# Patient Record
Sex: Female | Born: 1940
Health system: Southern US, Community
[De-identification: ages and names within clinical notes are randomized; demographics above are authoritative.]

## PROBLEM LIST (undated history)

## (undated) DIAGNOSIS — R5381 Other malaise: Secondary | ICD-10-CM

## (undated) DIAGNOSIS — IMO0002 Reserved for concepts with insufficient information to code with codable children: Secondary | ICD-10-CM

## (undated) DIAGNOSIS — E039 Hypothyroidism, unspecified: Secondary | ICD-10-CM

## (undated) DIAGNOSIS — E7800 Pure hypercholesterolemia, unspecified: Secondary | ICD-10-CM

## (undated) DIAGNOSIS — R002 Palpitations: Secondary | ICD-10-CM

## (undated) DIAGNOSIS — R5383 Other fatigue: Secondary | ICD-10-CM

## (undated) HISTORY — PX: CHOLECYSTECTOMY: SHX55

## (undated) HISTORY — DX: Reserved for concepts with insufficient information to code with codable children: IMO0002

## (undated) HISTORY — DX: Hypothyroidism, unspecified: E03.9

## (undated) HISTORY — PX: BACK SURGERY: SHX140

## (undated) HISTORY — DX: Pure hypercholesterolemia, unspecified: E78.00

## (undated) HISTORY — DX: Palpitations: R00.2

## (undated) HISTORY — PX: CATARACT EXTRACTION: SUR2

## (undated) HISTORY — DX: Other malaise: R53.81

## (undated) HISTORY — DX: Other fatigue: R53.83

---

## 1998-03-11 ENCOUNTER — Other Ambulatory Visit: Admission: RE | Admit: 1998-03-11 | Discharge: 1998-03-11 | Payer: Self-pay | Admitting: Obstetrics and Gynecology

## 1999-04-24 ENCOUNTER — Other Ambulatory Visit: Admission: RE | Admit: 1999-04-24 | Discharge: 1999-04-24 | Payer: Self-pay | Admitting: Obstetrics and Gynecology

## 2001-08-13 ENCOUNTER — Ambulatory Visit (HOSPITAL_COMMUNITY): Admission: RE | Admit: 2001-08-13 | Discharge: 2001-08-13 | Payer: Self-pay | Admitting: Gastroenterology

## 2005-09-26 ENCOUNTER — Other Ambulatory Visit
Admission: RE | Admit: 2005-09-26 | Discharge: 2005-09-26 | Payer: Self-pay | Admitting: Physical Medicine & Rehabilitation

## 2006-02-12 ENCOUNTER — Encounter (HOSPITAL_COMMUNITY): Admission: RE | Admit: 2006-02-12 | Discharge: 2006-03-14 | Payer: Self-pay | Admitting: Orthopedic Surgery

## 2006-11-06 ENCOUNTER — Other Ambulatory Visit: Admission: RE | Admit: 2006-11-06 | Discharge: 2006-11-06 | Payer: Self-pay | Admitting: Obstetrics and Gynecology

## 2007-11-10 ENCOUNTER — Other Ambulatory Visit: Admission: RE | Admit: 2007-11-10 | Discharge: 2007-11-10 | Payer: Self-pay | Admitting: Obstetrics and Gynecology

## 2008-11-15 ENCOUNTER — Other Ambulatory Visit: Admission: RE | Admit: 2008-11-15 | Discharge: 2008-11-15 | Payer: Self-pay | Admitting: Obstetrics and Gynecology

## 2009-11-15 ENCOUNTER — Other Ambulatory Visit: Admission: RE | Admit: 2009-11-15 | Discharge: 2009-11-15 | Payer: Self-pay | Admitting: Obstetrics and Gynecology

## 2013-01-12 ENCOUNTER — Ambulatory Visit: Payer: Self-pay | Admitting: Internal Medicine

## 2013-02-02 ENCOUNTER — Ambulatory Visit (INDEPENDENT_AMBULATORY_CARE_PROVIDER_SITE_OTHER): Payer: Medicare HMO | Admitting: Cardiology

## 2013-02-02 ENCOUNTER — Encounter: Payer: Self-pay | Admitting: Cardiology

## 2013-02-02 ENCOUNTER — Encounter (INDEPENDENT_AMBULATORY_CARE_PROVIDER_SITE_OTHER): Payer: Self-pay

## 2013-02-02 VITALS — BP 152/72 | HR 78 | Ht 67.0 in | Wt 156.0 lb

## 2013-02-02 DIAGNOSIS — R5383 Other fatigue: Secondary | ICD-10-CM | POA: Insufficient documentation

## 2013-02-02 DIAGNOSIS — R002 Palpitations: Secondary | ICD-10-CM | POA: Insufficient documentation

## 2013-02-02 DIAGNOSIS — IMO0002 Reserved for concepts with insufficient information to code with codable children: Secondary | ICD-10-CM | POA: Insufficient documentation

## 2013-02-02 DIAGNOSIS — I10 Essential (primary) hypertension: Secondary | ICD-10-CM

## 2013-02-02 DIAGNOSIS — R5381 Other malaise: Secondary | ICD-10-CM | POA: Insufficient documentation

## 2013-02-02 DIAGNOSIS — E039 Hypothyroidism, unspecified: Secondary | ICD-10-CM

## 2013-02-02 DIAGNOSIS — R9431 Abnormal electrocardiogram [ECG] [EKG]: Secondary | ICD-10-CM

## 2013-02-02 NOTE — Patient Instructions (Signed)
Your physician has requested that you have an echocardiogram. Echocardiography is a painless test that uses sound waves to create images of your heart. It provides your doctor with information about the size and shape of your heart and how well your hearts chambers and valves are working. This procedure takes approximately one hour. There are no restrictions for this procedure.  Your physician has recommended that you wear a 24 HOUR holter monitor. Holter monitors are medical devices that record the hearts electrical activity. Doctors most often use these monitors to diagnose arrhythmias. Arrhythmias are problems with the speed or rhythm of the heartbeat. The monitor is a small, portable device. You can wear one while you do your normal daily activities. This is usually used to diagnose what is causing palpitations/syncope (passing out).  Your physician recommends that you schedule a follow-up appointment in: 2 MONTHS WITH DR. Anne FuSKAINS  Your physician recommends that you continue on your current medications as directed. Please refer to the Current Medication list given to you today.

## 2013-02-02 NOTE — Progress Notes (Signed)
[address] [place], [place]  [address] Phone: [phone] Fax:  [phone]  Date:  02/02/2013   ID:  Shari Camacho, DOB 1940/09/02, MRN [id number]  PCP:  No primary provider on file.   History of Present Illness: Shari Camacho is a 73 y.o. female here for evaluation of palpitations, she feels the sometimes at night. 12/14 - Cataracts removed. Really noted PVC's then. The anesthesiologist noted this preoperatively and asked her to have this further evaluated. She states that approximately 6 years ago she was diagnosed with PVCs after she stopped her hormone supplementation when a sibling had breast cancer. No syncope, no chest pain. Feels tired when rate decreased. PVC's diagnosed 6 years ago. Very seldom SOB. Walks 3 miles daily at with no problems. Up hill feels it. Takes Maxide for edema not necessarity BP.    Wt Readings from Last 3 Encounters:  02/02/13 156 lb (70.761 kg)     Past Medical History  Diagnosis Date   Heart palpitations    Other malaise and fatigue    Body mass index between 19-24, adult    Thyroid disease     Past Surgical History  Procedure Laterality Date   Cataract extraction     Back surgery      Current Outpatient Prescriptions  Medication Sig Dispense Refill   aspirin 81 MG tablet Take 81 mg by mouth daily.       atorvastatin (LIPITOR) 20 MG tablet 1/2 tab po qd       Bromfenac Sodium (PROLENSA) 0.07 % SOLN Apply to eye as directed.       Cod Liver Oil (COD LIVER PO) Take 1 tablet by mouth daily.       cyanocobalamin 2000 MCG tablet Take 2,000 mcg by mouth daily.       GLUCOSAMINE-CHONDROITIN PO Take 1 tablet by mouth daily.       KRILL OIL PO Take 1 tablet by mouth daily.       ofloxacin (OCUFLOX) 0.3 % ophthalmic solution As directed       OYSTER SHELL PO Take 1 tablet by mouth daily.       SYNTHROID 100 MCG tablet Take 1 tablet by mouth daily.       triamterene-hydrochlorothiazide (MAXZIDE-25)  37.5-25 MG per tablet Take 1 tablet by mouth daily.       No current facility-administered medications for this visit.    Allergies:   No Known Allergies  Social History:  The patient  reports that she has never smoked. She does not have any smokeless tobacco history on file. She reports that she does not drink alcohol or use illicit drugs. One coffee in morning.   No family history on file.  ROS:  Please see the history of present illness.   Denies any syncope, bleeding, orthopnea, PND, rash, strokelike symptoms   All other systems reviewed and negative.   PHYSICAL EXAM: VS:  BP 152/72   Pulse 78   Ht 5\' 7"  (1.702 m)   Wt 156 lb (70.761 kg)   BMI 24.43 kg/m2 Well nourished, well developed, in no acute distress HEENT: normal, [place]/AT, EOMI Neck: no JVD, normal carotid upstroke, no bruit Cardiac:  normal S1, S2; RRR; no murmurOccasional ectopy Lungs:  clear to auscultation bilaterally, no wheezing, rhonchi or rales Abd: soft, nontender, no hepatomegaly, no bruits Ext: no edema, 2+ distal pulses Skin: warm and dry GU: deferred Neuro: no focal abnormalities noted, AAO x 3  EKG:  Sinus rhythm rate 78 with frequent PVCs, unifocal, old septal infarct pattern, questionable left posterior fascicular block.   Old EKG very similar.  Labs: 02/21/12- LDL 139, TSH 5.8  Prior medical records reviewed.  ASSESSMENT AND PLAN:  1. Frequent PVCs/ Abnormal ECG/ palpitations-EKG is compared and demonstrate unifocal PVCs, frequent. I will check a 24-hour monitor to further quantify number PVCs. If greater than 10,000 I will ask her to take her metoprolol succinate 25 mg once a day to help further suppress as frequent PVCs in this number can sometimes lead to cardiomyopathy. I will also check an echocardiogram to further ensure she has proper structure and function. Limit overall caffeine use, avoid decongestants such as Sudafed. Continue with walking, she has done this for quite some time. She is not  limited by this. Her occasional bradycardia picked up on home blood pressure monitoring is likely secondary to frequent PVCs/possible bigeminy. 2. Elevated blood pressure without diagnosis of hypertension-she states that she takes her triamterene hydrochlorothiazide secondary to minor edema. Her blood pressure today is mildly elevated. 3. Hypothyroidism-recent TSH was normal. She is having this checked again soon. Of course, over treatment of hyperthyroidism may result in more frequent PVCs.    Signed, Donato SchultzMark Jaselle Pryer, MD [facility]  02/02/2013 1:23 PM

## 2013-02-04 ENCOUNTER — Ambulatory Visit (HOSPITAL_COMMUNITY): Payer: Medicare HMO | Attending: Cardiology | Admitting: Radiology

## 2013-02-04 ENCOUNTER — Encounter: Payer: Self-pay | Admitting: Cardiology

## 2013-02-04 DIAGNOSIS — R5383 Other fatigue: Secondary | ICD-10-CM | POA: Insufficient documentation

## 2013-02-04 DIAGNOSIS — R002 Palpitations: Secondary | ICD-10-CM | POA: Insufficient documentation

## 2013-02-04 DIAGNOSIS — R0602 Shortness of breath: Secondary | ICD-10-CM | POA: Insufficient documentation

## 2013-02-04 DIAGNOSIS — E039 Hypothyroidism, unspecified: Secondary | ICD-10-CM | POA: Insufficient documentation

## 2013-02-04 DIAGNOSIS — R5381 Other malaise: Secondary | ICD-10-CM | POA: Insufficient documentation

## 2013-02-04 DIAGNOSIS — R609 Edema, unspecified: Secondary | ICD-10-CM | POA: Insufficient documentation

## 2013-02-04 NOTE — Progress Notes (Signed)
Echocardiogram performed.

## 2013-02-06 ENCOUNTER — Encounter: Payer: Self-pay | Admitting: Radiology

## 2013-02-06 ENCOUNTER — Encounter (INDEPENDENT_AMBULATORY_CARE_PROVIDER_SITE_OTHER): Payer: Medicare HMO

## 2013-02-06 DIAGNOSIS — R9431 Abnormal electrocardiogram [ECG] [EKG]: Secondary | ICD-10-CM

## 2013-02-06 DIAGNOSIS — R002 Palpitations: Secondary | ICD-10-CM

## 2013-02-06 NOTE — Progress Notes (Signed)
Patient ID: Shari Camacho, female   DOB: 10/25/1940, 73 y.o.   MRN: [id number] E Cardio 24 hr holter monitor applied

## 2013-02-10 NOTE — Patient Instructions (Signed)
°

## 2013-02-10 NOTE — Patient Instructions (Deleted)
°

## 2013-02-10 NOTE — Procedures (Deleted)
°

## 2013-03-02 ENCOUNTER — Telehealth: Payer: Self-pay | Admitting: Cardiology

## 2013-03-02 MED ORDER — METOPROLOL SUCCINATE ER 25 MG PO TB24: 25.0000 mg | ORAL_TABLET | Freq: Every day | ORAL | Status: AC

## 2013-03-02 NOTE — Telephone Encounter (Signed)
Holter Results and medication change.

## 2013-03-31 ENCOUNTER — Encounter: Payer: Self-pay | Admitting: Cardiology

## 2013-04-06 ENCOUNTER — Encounter: Payer: Self-pay | Admitting: Cardiology

## 2013-04-06 ENCOUNTER — Ambulatory Visit (INDEPENDENT_AMBULATORY_CARE_PROVIDER_SITE_OTHER): Payer: Medicare HMO | Admitting: Cardiology

## 2013-04-06 VITALS — BP 118/71 | HR 64 | Ht 67.0 in | Wt 156.0 lb

## 2013-04-06 DIAGNOSIS — I4949 Other premature depolarization: Secondary | ICD-10-CM

## 2013-04-06 DIAGNOSIS — I493 Ventricular premature depolarization: Secondary | ICD-10-CM

## 2013-04-06 DIAGNOSIS — Z79899 Other long term (current) drug therapy: Secondary | ICD-10-CM

## 2013-04-06 DIAGNOSIS — R002 Palpitations: Secondary | ICD-10-CM

## 2013-04-06 NOTE — Patient Instructions (Signed)
Your physician recommends that you continue on your current medications as directed. Please refer to the Current Medication list given to you today.  Your physician wants you to follow-up in: 6 months with Dr. Skains. You will receive a reminder letter in the mail two months in advance. If you don't receive a letter, please call our office to schedule the follow-up appointment.  

## 2013-04-06 NOTE — Progress Notes (Signed)
[address] [place], [place]  [address] Phone: [phone] Fax:  [phone] Date:  04/06/2013   ID:  Shari Camacho, DOB 04/15/1940, MRN [id number]  PCP:  No primary provider on file.   History of Present Illness: Shari Camacho is a 73 y.o. female here for evaluation followup of palpitations and new start Toprol 25 mg. 24-hour Holter monitor in February of 2015 demonstrated frequent PVCs. She was instructed to begin metoprolol at that time. Previously she described palpitations occasionally at night. 12/14 - Cataracts removed. Really noted PVC's then. The anesthesiologist noted this preoperatively and asked her to have this further evaluated. She states that approximately 6 years ago she was diagnosed with PVCs after she stopped her hormone supplementation when a sibling had breast cancer. No syncope, no chest pain. Feels tired when rate decreased. Very seldom SOB. Walks 3 miles daily at 30min with no problems. Up hill feels it. Takes Maxide for edema not necessarity BP.   She has been doing very well with the Toprol. She has been taking at night. She would rather take that once daily dosing. 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 Unfortunately, the grandaughters parents were killed a few years ago.   Wt Readings from Last 3 Encounters:  04/06/13 156 lb (70.761 kg)  02/02/13 156 lb (70.761 kg)     Past Medical History  Diagnosis Date  . Heart palpitations   . Other malaise and fatigue   . Body mass index between 19-24, adult   . Hypothyroidism     Past Surgical History  Procedure Laterality Date  . Cataract extraction    . Back surgery      Current Outpatient Prescriptions  Medication Sig Dispense Refill  . aspirin 81 MG tablet Take 81 mg by mouth daily.      Marland Kitchen. atorvastatin (LIPITOR) 20 MG tablet 1/2 tab po qd      . Cod Liver Oil (COD LIVER PO) Take 1 tablet by mouth daily.      Marland Kitchen. GLUCOSAMINE-CHONDROITIN PO  Take 1 tablet by mouth daily.      Marland Kitchen. KRILL OIL PO Take 1 tablet by mouth daily.      . metoprolol succinate (TOPROL XL) 25 MG 24 hr tablet Take 1 tablet (25 mg total) by mouth daily.  30 tablet  5  . OYSTER SHELL PO Take 1 tablet by mouth daily.      Marland Kitchen. SYNTHROID 100 MCG tablet Take 1 tablet by mouth daily.      Marland Kitchen. triamterene-hydrochlorothiazide (MAXZIDE-25) 37.5-25 MG per tablet Take 1 tablet by mouth daily.       No current facility-administered medications for this visit.    Allergies:   No Known Allergies  Social History:  The patient  reports that she has never smoked. She does not have any smokeless tobacco history on file. She reports that she does not drink alcohol or use illicit drugs. One coffee in morning.   No family history on file.  ROS:  Please see the history of present illness.   Denies any syncope, bleeding, orthopnea, PND, rash, strokelike symptoms   All other systems reviewed and negative.   PHYSICAL EXAM: VS:  BP 118/71  Pulse 64  Ht 5\' 7"  (1.702 m)  Wt 156 lb (70.761 kg)  BMI 24.43 kg/m2 Well nourished, well developed, in no acute distress HEENT: normal, [place]/AT, EOMI Neck: no JVD, normal carotid upstroke,  no bruit Cardiac:  normal S1, S2; RRR; no murmur. No ectopy Lungs:  clear to auscultation bilaterally, no wheezing, rhonchi or rales Abd: soft, nontender, no hepatomegaly, no bruits Ext: no edema, 2+ distal pulses Skin: warm and dry GU: deferred Neuro: no focal abnormalities noted, AAO x 3  EKG:  Sinus rhythm rate 78 with frequent PVCs, unifocal, old septal infarct pattern, questionable left posterior fascicular block.   Old EKG very similar.  Echocardiogram 02/04/13-EF 60%, normal structure  Holter monitor: 1/15-frequent PVCs-1267  Labs: 02/21/12- LDL 139, TSH 5.8  Prior medical records reviewed.  ASSESSMENT AND PLAN:  1. Frequent PVCs/ Abnormal ECG/ palpitations- 1267 PVCs 2/15. Metoprolol recommended. EKG is compared and demonstrate unifocal PVCs,  frequent. Echocardiogram reassuring. Limit overall caffeine use, avoid decongestants such as Sudafed. Continue with walking, she has done this for quite some time. She is not limited by this. Her occasional bradycardia picked up on home blood pressure monitoring is likely secondary to frequent PVCs/possible bigeminy. 2. Elevated blood pressure without diagnosis of hypertension-she states that she takes her triamterene hydrochlorothiazide secondary to minor edema. Her blood pressure today is normal. 3. Hypothyroidism-recent TSH was normal.  Of course, over treatment of hyperthyroidism may result in more frequent PVCs. 4. 6 month follow up.    Signed, Donato Schultz, MD [facility]  04/06/2013 7:56 AM

## 2017-02-14 ENCOUNTER — Ambulatory Visit (INDEPENDENT_AMBULATORY_CARE_PROVIDER_SITE_OTHER): Payer: Medicare Other | Admitting: Orthopaedic Surgery

## 2017-02-14 ENCOUNTER — Encounter (INDEPENDENT_AMBULATORY_CARE_PROVIDER_SITE_OTHER): Payer: Self-pay | Admitting: Orthopaedic Surgery

## 2017-02-14 ENCOUNTER — Ambulatory Visit (INDEPENDENT_AMBULATORY_CARE_PROVIDER_SITE_OTHER): Payer: Medicare Other

## 2017-02-14 VITALS — BP 123/58 | HR 76 | Resp 14 | Ht 68.0 in | Wt 156.0 lb

## 2017-02-14 DIAGNOSIS — M25561 Pain in right knee: Secondary | ICD-10-CM | POA: Diagnosis not present

## 2017-02-14 NOTE — Progress Notes (Signed)
Office Visit Note   Patient: Shari Camacho           Date of Birth: 03/08/1940           MRN: [id number] Visit Date: 02/14/2017              Requested by: No referring provider defined for this encounter. PCP: Toma DeitersHasanaj, Xaje A, MD   Assessment & Plan: Visit Diagnoses:  1. Right knee pain, unspecified chronicity     Plan:  #1: We discussed options which included that of a corticosteroid injection, NSAIDs, exercises/physical therapy, and with persistent symptoms MRI scan. She would like to stay with conservative treatment and she will use Motrin 2 tablets every 8 hours with food and see if this makes a difference for her. If not then she may consider corticosteroid injection.  Follow-Up Instructions: Return if symptoms worsen or fail to improve.   Face-to-face time spent with patient was greater than 30 minutes.  Greater than 50% of the time was spent in counseling and coordination of care. Were gone over in detail all the options. We've answered all her questions. She therefore would like to stay markedly conservative at this time with no treatment other than oral anti-inflammatories. She stated that if it does not work then she'll come back for the injection.   Orders:  Orders Placed This Encounter  Procedures  . XR KNEE 3 VIEW RIGHT   No orders of the defined types were placed in this encounter.     Procedures: No procedures performed   Clinical Data: No additional findings.   Subjective: Chief Complaint  Patient presents with  . Right Knee - Pain, Weakness    Shari Camacho is a' 77 y o here today for Right knee pain x 2 months.  Swelling and painful with various movements.     HPI  Shari Camacho is a very pleasant 77 year old white female who is seen today for evaluation of 2 month history of right knee pain. She states that time she feels as if something is catching in her knee. This she worsens as she is going out doing antiquing. She denies any occult giving way. Denies  any swelling at this time. States the pain is intermittent and mild this time. Denies any history of injury or trauma.  Review of Systems  Constitutional: Negative for chills, fatigue and fever.  Eyes: Negative for itching.  Respiratory: Negative for chest tightness and shortness of breath.   Cardiovascular: Negative for chest pain, palpitations and leg swelling.  Gastrointestinal: Negative for blood in stool, constipation and diarrhea.  Endocrine: Negative for polyuria.  Genitourinary: Negative for dysuria.  Musculoskeletal: Positive for joint swelling. Negative for back pain, neck pain and neck stiffness.  Allergic/Immunologic: Negative for immunocompromised state.  Neurological: Negative for dizziness and numbness.  Hematological: Does not bruise/bleed easily.  Psychiatric/Behavioral: The patient is nervous/anxious.      Objective: Vital Signs: BP (!) 123/58   Pulse 76   Resp 14   Ht 5\' 8"  (1.727 m)   Wt 156 lb (70.8 kg)   BMI 23.72 kg/m   Physical Exam  Constitutional: She is oriented to person, place, and time. She appears well-developed and well-nourished.  HENT:  Head: Normocephalic and atraumatic.  Eyes: EOM are normal. Pupils are equal, round, and reactive to light.  Pulmonary/Chest: Effort normal.  Neurological: She is alert and oriented to person, place, and time.  Skin: Skin is warm and dry.  Psychiatric: She has a normal mood and  affect. Her behavior is normal. Judgment and thought content normal.    Ortho Exam  About today the knee reveals a minimal effusion. She has range of motion from 0 to 105. She's not particular painful at the joint lines today. McMurray's does give her a little bit of pain more in the out outer aspect laterally. Negative Lachman and drawer. Good motion of her hip without pain or discomfort.  Specialty Comments:  No specialty comments available.  Imaging: Xr Knee 3 View Right  Result Date: 02/14/2017 Three-view x-ray of the right  knee reveals maintenance of the joint space with little bit of narrowing noted medially. Assessment. Trigger spurring along the lateral tibial plateau. Peaking of the intercondylar spines noted. Some irregularity of the lateral femoral condyle on the AP. She does have some spurring superior aspect of the patella at the insertion of the quadriceps tendon. Small spurs superior and inferiorly on the intra-articular portion of patella.    PMFS History: Patient Active Problem List   Diagnosis Date Noted  . Heart palpitations   . Other malaise and fatigue   . Body mass index between 19-24, adult   . Hypothyroidism    Past Medical History:  Diagnosis Date  . Body mass index between 19-24, adult   . Heart palpitations   . Hypothyroidism   . Other malaise and fatigue     History reviewed. No pertinent family history.  Past Surgical History:  Procedure Laterality Date  . BACK SURGERY    . CATARACT EXTRACTION     Social History   Occupational History  . Not on file  Tobacco Use  . Smoking status: Never Smoker  . Smokeless tobacco: Never Used  Substance and Sexual Activity  . Alcohol use: No  . Drug use: No  . Sexual activity: Not on file

## 2017-02-14 NOTE — Patient Instructions (Signed)

## 2017-02-15 ENCOUNTER — Ambulatory Visit (INDEPENDENT_AMBULATORY_CARE_PROVIDER_SITE_OTHER): Payer: Self-pay | Admitting: Orthopaedic Surgery

## 2017-03-21 ENCOUNTER — Other Ambulatory Visit (INDEPENDENT_AMBULATORY_CARE_PROVIDER_SITE_OTHER): Payer: Self-pay

## 2017-03-21 ENCOUNTER — Encounter (INDEPENDENT_AMBULATORY_CARE_PROVIDER_SITE_OTHER): Payer: Self-pay | Admitting: Orthopaedic Surgery

## 2017-03-21 ENCOUNTER — Ambulatory Visit (INDEPENDENT_AMBULATORY_CARE_PROVIDER_SITE_OTHER): Payer: Medicare Other | Admitting: Orthopaedic Surgery

## 2017-03-21 ENCOUNTER — Other Ambulatory Visit (INDEPENDENT_AMBULATORY_CARE_PROVIDER_SITE_OTHER): Payer: Self-pay | Admitting: *Deleted

## 2017-03-21 VITALS — BP 173/74 | HR 83 | Resp 16 | Ht 67.0 in | Wt 157.0 lb

## 2017-03-21 DIAGNOSIS — M1711 Unilateral primary osteoarthritis, right knee: Secondary | ICD-10-CM | POA: Diagnosis not present

## 2017-03-21 DIAGNOSIS — M25561 Pain in right knee: Secondary | ICD-10-CM

## 2017-03-21 MED ORDER — BUPIVACAINE HCL 0.5 % IJ SOLN
2.0000 mL | INTRAMUSCULAR | Status: AC | PRN
  Administered 2017-03-21: 2 mL via INTRA_ARTICULAR

## 2017-03-21 MED ORDER — METHYLPREDNISOLONE ACETATE 40 MG/ML IJ SUSP
80.0000 mg | INTRAMUSCULAR | Status: AC | PRN
  Administered 2017-03-21: 80 mg

## 2017-03-21 MED ORDER — LIDOCAINE HCL 1 % IJ SOLN
2.0000 mL | INTRAMUSCULAR | Status: AC | PRN
  Administered 2017-03-21: 2 mL

## 2017-03-21 NOTE — Progress Notes (Signed)
Office Visit Note   Patient: Shari Camacho           Date of Birth: 11/07/1940           MRN: [id number] Visit Date: 03/21/2017              Requested by: Toma DeitersHasanaj, Xaje A, MD [address] [place], [place] [id number] PCP: Toma DeitersHasanaj, Xaje A, MD   Assessment & Plan: Visit Diagnoses:  1. Unilateral primary osteoarthritis, right knee     Plan: Acute onset of chronic right knee pain probably related to the osteoarthritis. Aspirated knee of about 12 mL of somewhat bloody fluid and injected cortisone. We'll send the fluid to the lab. Activities as tolerated. Return in 7-10 days  Follow-Up Instructions: Return if symptoms worsen or fail to improve.   Orders:  No orders of the defined types were placed in this encounter.  No orders of the defined types were placed in this encounter.     Procedures: Large Joint Inj: R knee on 03/21/2017 4:44 PM Indications: pain and diagnostic evaluation Details: 25 G 1.5 in needle, anteromedial approach  Arthrogram: No  Medications: 2 mL lidocaine 1 %; 2 mL bupivacaine 0.5 %; 80 mg methylPREDNISolone acetate 40 MG/ML Aspirate: 12 mL; sent for lab analysis Outcome: tolerated well, no immediate complications Procedure, treatment alternatives, risks and benefits explained, specific risks discussed. Consent was given by the patient. Immediately prior to procedure a time out was called to verify the correct patient, procedure, equipment, support staff and site/side marked as required. Patient was prepped and draped in the usual sterile fashion.       Clinical Data: No additional findings.   Subjective: Chief Complaint  Patient presents with  . Right Knee - Pain  . Knee Pain    Right knee pain, swelling, walking - felt pop in back of knee today, difficulty walking, weakness, difficulty bearing weight, no surgery to knee, not diabetic  Seen about a month ago for evaluation of right knee pain that was felt to be osteoarthritis. Plain films reveal  some degenerative changes in all 3 compartments were relatively mild. She's been taking over-the-counter medicine. Just recently felt a "pop" and behind her right knee associated with pain without significant injury or trauma. She's had some swelling of her knee and difficulty bearing weight. On occasion she feels like her knee may want to give way. No fever chills skin rashes. Denies back pain or groin discomfort.  HPI  Review of Systems  Constitutional: Positive for activity change.  HENT: Negative for trouble swallowing.   Eyes: Negative for pain.  Respiratory: Negative for shortness of breath.   Cardiovascular: Positive for leg swelling.  Gastrointestinal: Negative for constipation.  Endocrine: Negative for cold intolerance.  Genitourinary: Negative for difficulty urinating.  Musculoskeletal: Positive for gait problem and joint swelling.  Skin: Negative for rash.  Allergic/Immunologic: Negative for food allergies.  Neurological: Positive for weakness.  Hematological: Does not bruise/bleed easily.  Psychiatric/Behavioral: Negative for sleep disturbance.     Objective: Vital Signs: BP (!) 173/74 (BP Location: Left Arm, Patient Position: Sitting, Cuff Size: Normal)   Pulse 83   Resp 16   Ht 5\' 7"  (1.702 m)   Wt 157 lb (71.2 kg)   BMI 24.59 kg/m   Physical Exam  Ortho Exam right knee exam with  small effusion. Some medial joint tenderness. No popliteal fullness or pain. No calf discomfort. Neurovascular exam intact. No distal edema. No instability. Some patellar crepitation. Knee now  warm or red. Full extension and flexed over 100.  Specialty Comments:  No specialty comments available.  Imaging: No results found.   PMFS History: Patient Active Problem List   Diagnosis Date Noted  . Unilateral primary osteoarthritis, right knee 03/21/2017  . Heart palpitations   . Other malaise and fatigue   . Body mass index between 19-24, adult   . Hypothyroidism    Past Medical  History:  Diagnosis Date  . Body mass index between 19-24, adult   . Heart palpitations   . High cholesterol   . Hypothyroidism   . Other malaise and fatigue     History reviewed. No pertinent family history.  Past Surgical History:  Procedure Laterality Date  . BACK SURGERY    . CATARACT EXTRACTION     Social History   Occupational History  . Not on file  Tobacco Use  . Smoking status: Never Smoker  . Smokeless tobacco: Never Used  Substance and Sexual Activity  . Alcohol use: No  . Drug use: No  . Sexual activity: Not on file

## 2017-03-22 LAB — SYNOVIAL CELL COUNT + DIFF, W/ CRYSTALS
Basophils, %: 0 %
Eosinophils-Synovial: 1 % (ref 0–2)
Lymphocytes-Synovial Fld: 15 % (ref 0–74)
Monocyte/Macrophage: 44 % (ref 0–69)
Neutrophil, Synovial: 40 % — ABNORMAL HIGH (ref 0–24)
Synoviocytes, %: 0 % (ref 0–15)
WBC, Synovial: 186 cells/uL — ABNORMAL HIGH (ref <150)

## 2017-03-27 LAB — ANAEROBIC AND AEROBIC CULTURE
AER RESULT:: NO GROWTH
MICRO NUMBER:: 90266618
MICRO NUMBER:: 90266930
SPECIMEN QUALITY:: ADEQUATE
SPECIMEN QUALITY:: ADEQUATE

## 2017-04-02 ENCOUNTER — Other Ambulatory Visit (INDEPENDENT_AMBULATORY_CARE_PROVIDER_SITE_OTHER): Payer: Self-pay | Admitting: Radiology

## 2017-04-02 ENCOUNTER — Other Ambulatory Visit (INDEPENDENT_AMBULATORY_CARE_PROVIDER_SITE_OTHER): Payer: Self-pay | Admitting: Orthopedic Surgery

## 2017-04-02 ENCOUNTER — Telehealth (INDEPENDENT_AMBULATORY_CARE_PROVIDER_SITE_OTHER): Payer: Self-pay | Admitting: Orthopaedic Surgery

## 2017-04-02 MED ORDER — TRAMADOL HCL 50 MG PO TABS: ORAL_TABLET | ORAL | 0 refills | Status: DC

## 2017-04-02 MED ORDER — TRAMADOL HCL 50 MG PO TABS: 50.0000 mg | ORAL_TABLET | Freq: Every evening | ORAL | 0 refills | Status: AC | PRN

## 2017-04-02 NOTE — Telephone Encounter (Signed)
Ice is good-if still having pain think an MRI of her knee would be helpful. Exercises are fine if she can tolerate. Can try tramadol if she wants. Tramadol 50 mg #30 1 tab po tid prn. Please call

## 2017-04-02 NOTE — Telephone Encounter (Signed)
PLEASE ADVISE.

## 2017-04-02 NOTE — Telephone Encounter (Signed)
Patient left a message wanting to know if she there any exercises she can do to help with the pain.  She is using ice, which helps some.  [phone].  Thank you.

## 2017-04-03 ENCOUNTER — Telehealth (INDEPENDENT_AMBULATORY_CARE_PROVIDER_SITE_OTHER): Payer: Self-pay | Admitting: Orthopaedic Surgery

## 2017-04-03 ENCOUNTER — Other Ambulatory Visit (INDEPENDENT_AMBULATORY_CARE_PROVIDER_SITE_OTHER): Payer: Self-pay | Admitting: Radiology

## 2017-04-03 DIAGNOSIS — M25561 Pain in right knee: Secondary | ICD-10-CM

## 2017-04-03 NOTE — Telephone Encounter (Signed)
Shari [name] sent meds in  And ordered mri of right knee.

## 2017-04-03 NOTE — Telephone Encounter (Signed)
Patient called asking how long she needs to wait before she can get another injection in her right knee.  Patient also wanted to know if there are any exercises that Dr. Cleophas DunkerWhitfield recommended that she could do at home.

## 2017-04-04 NOTE — Telephone Encounter (Signed)
Please advise 

## 2017-04-05 ENCOUNTER — Telehealth (INDEPENDENT_AMBULATORY_CARE_PROVIDER_SITE_OTHER): Payer: Self-pay | Admitting: Orthopaedic Surgery

## 2017-04-05 NOTE — Patient Instructions (Signed)
Knee exercises  Knee Exercises Ask your health care provider which exercises are safe for you. Do exercises exactly as told by your health care provider and adjust them as directed. It is normal to feel mild stretching, pulling, tightness, or discomfort as you do these exercises, but you should stop right away if you feel sudden pain or your pain gets worse.Do not begin these exercises until told by your health care provider. STRETCHING AND RANGE OF MOTION EXERCISES These exercises warm up your muscles and joints and improve the movement and flexibility of your knee. These exercises also help to relieve pain, numbness, and tingling. Exercise A: Knee Extension, Prone 1. Lie on your abdomen on a bed. 2. Place your left / right knee just beyond the edge of the surface so your knee is not on the bed. You can put a towel under your left / right thigh just above your knee for comfort. 3. Relax your leg muscles and allow gravity to straighten your knee. You should feel a stretch behind your left / right knee. 4. Hold this position for __________ seconds. 5. Scoot up so your knee is supported between repetitions. Repeat __________ times. Complete this stretch __________ times a day. Exercise B: Knee Flexion, Active  1. Lie on your back with both knees straight. If this causes back discomfort, bend your left / right knee so your foot is flat on the floor. 2. Slowly slide your left / right heel back toward your buttocks until you feel a gentle stretch in the front of your knee or thigh. 3. Hold this position for __________ seconds. 4. Slowly slide your left / right heel back to the starting position. Repeat __________ times. Complete this exercise __________ times a day. Exercise C: Quadriceps, Prone  1. Lie on your abdomen on a firm surface, such as a bed or padded floor. 2. Bend your left / right knee and hold your ankle. If you cannot reach your ankle or pant leg, loop a belt around your foot and  grab the belt instead. 3. Gently pull your heel toward your buttocks. Your knee should not slide out to the side. You should feel a stretch in the front of your thigh and knee. 4. Hold this position for __________ seconds. Repeat __________ times. Complete this stretch __________ times a day. Exercise D: Hamstring, Supine 1. Lie on your back. 2. Loop a belt or towel over the ball of your left / right foot. The ball of your foot is on the walking surface, right under your toes. 3. Straighten your left / right knee and slowly pull on the belt to raise your leg until you feel a gentle stretch behind your knee. ? Do not let your left / right knee bend while you do this. ? Keep your other leg flat on the floor. 4. Hold this position for __________ seconds. Repeat __________ times. Complete this stretch __________ times a day. STRENGTHENING EXERCISES These exercises build strength and endurance in your knee. Endurance is the ability to use your muscles for a long time, even after they get tired. Exercise E: Quadriceps, Isometric  1. Lie on your back with your left / right leg extended and your other knee bent. Put a rolled towel or small pillow under your knee if told by your health care provider. 2. Slowly tense the muscles in the front of your left / right thigh. You should see your kneecap slide up toward your hip or see increased dimpling just above  the knee. This motion will push the back of the knee toward the floor. 3. For __________ seconds, keep the muscle as tight as you can without increasing your pain. 4. Relax the muscles slowly and completely. Repeat __________ times. Complete this exercise __________ times a day. Exercise F: Straight Leg Raises - Quadriceps 1. Lie on your back with your left / right leg extended and your other knee bent. 2. Tense the muscles in the front of your left / right thigh. You should see your kneecap slide up or see increased dimpling just above the knee.  Your thigh may even shake a bit. 3. Keep these muscles tight as you raise your leg 4-6 inches (10-15 cm) off the floor. Do not let your knee bend. 4. Hold this position for __________ seconds. 5. Keep these muscles tense as you lower your leg. 6. Relax your muscles slowly and completely after each repetition. Repeat __________ times. Complete this exercise __________ times a day. Exercise G: Hamstring, Isometric 1. Lie on your back on a firm surface. 2. Bend your left / right knee approximately __________ degrees. 3. Dig your left / right heel into the surface as if you are trying to pull it toward your buttocks. Tighten the muscles in the back of your thighs to dig as hard as you can without increasing any pain. 4. Hold this position for __________ seconds. 5. Release the tension gradually and allow your muscles to relax completely for __________ seconds after each repetition. Repeat __________ times. Complete this exercise __________ times a day. Exercise H: Hamstring Curls  If told by your health care provider, do this exercise while wearing ankle weights. Begin with __________ weights. Then increase the weight by 1 lb (0.5 kg) increments. Do not wear ankle weights that are more than __________. 1. Lie on your abdomen with your legs straight. 2. Bend your left / right knee as far as you can without feeling pain. Keep your hips flat against the floor. 3. Hold this position for __________ seconds. 4. Slowly lower your leg to the starting position.  Repeat __________ times. Complete this exercise __________ times a day. Exercise I: Squats (Quadriceps) 1. Stand in front of a table, with your feet and knees pointing straight ahead. You may rest your hands on the table for balance but not for support. 2. Slowly bend your knees and lower your hips like you are going to sit in a chair. ? Keep your weight over your heels, not over your toes. ? Keep your lower legs upright so they are parallel  with the table legs. ? Do not let your hips go lower than your knees. ? Do not bend lower than told by your health care provider. ? If your knee pain increases, do not bend as low. 3. Hold the squat position for __________ seconds. 4. Slowly push with your legs to return to standing. Do not use your hands to pull yourself to standing. Repeat __________ times. Complete this exercise __________ times a day. Exercise J: Wall Slides (Quadriceps)  1. Lean your back against a smooth wall or door while you walk your feet out 18-24 inches (46-61 cm) from it. 2. Place your feet hip-width apart. 3. Slowly slide down the wall or door until your knees bend __________ degrees. Keep your knees over your heels, not over your toes. Keep your knees in line with your hips. 4. Hold for __________ seconds. Repeat __________ times. Complete this exercise __________ times a day. Exercise K: Straight  Leg Raises - Hip Abductors 1. Lie on your side with your left / right leg in the top position. Lie so your head, shoulder, knee, and hip line up. You may bend your bottom knee to help you keep your balance. 2. Roll your hips slightly forward so your hips are stacked directly over each other and your left / right knee is facing forward. 3. Leading with your heel, lift your top leg 4-6 inches (10-15 cm). You should feel the muscles in your outer hip lifting. ? Do not let your foot drift forward. ? Do not let your knee roll toward the ceiling. 4. Hold this position for __________ seconds. 5. Slowly return your leg to the starting position. 6. Let your muscles relax completely after each repetition. Repeat __________ times. Complete this exercise __________ times a day. Exercise L: Straight Leg Raises - Hip Extensors 1. Lie on your abdomen on a firm surface. You can put a pillow under your hips if that is more comfortable. 2. Tense the muscles in your buttocks and lift your left / right leg about 4-6 inches (10-15 cm).  Keep your knee straight as you lift your leg. 3. Hold this position for __________ seconds. 4. Slowly lower your leg to the starting position. 5. Let your leg relax completely after each repetition. Repeat __________ times. Complete this exercise __________ times a day. This information is not intended to replace advice given to you by your health care provider. Make sure you discuss any questions you have with your health care provider. Document Released: 11/22/2004 Document Revised: 10/03/2015 Document Reviewed: 11/14/2014 Elsevier Interactive Patient Education  2018 [employer]

## 2017-04-05 NOTE — Telephone Encounter (Signed)
Please advise 

## 2017-04-05 NOTE — Telephone Encounter (Signed)
Spoke with pt and printed out exercises for her to pick up at front desk.

## 2017-04-05 NOTE — Telephone Encounter (Signed)
Can download knee exerecises on computer or try PT once-ask pt. Should wait 2 mos between shots

## 2017-04-05 NOTE — Telephone Encounter (Signed)
Patient called stating that her right knee is feeling much better and wants to wait before she schedules her MRI.  Patient states that she has been stretching and icing and wants to see if it "will heal on its own."  If it gets bad again, patient states she will call to schedule the MRI.

## 2017-04-08 NOTE — Telephone Encounter (Signed)
MRI cancelled 

## 2017-04-08 NOTE — Telephone Encounter (Signed)
Ok to cancel MRI °

## 2017-04-18 ENCOUNTER — Ambulatory Visit
Admission: RE | Admit: 2017-04-18 | Discharge: 2017-04-18 | Disposition: A | Discharge status: Home / Self Care | Payer: Medicare Other | Source: Ambulatory Visit | Attending: Orthopaedic Surgery | Admitting: Orthopaedic Surgery

## 2017-04-18 DIAGNOSIS — M25561 Pain in right knee: Secondary | ICD-10-CM

## 2017-04-26 ENCOUNTER — Ambulatory Visit (INDEPENDENT_AMBULATORY_CARE_PROVIDER_SITE_OTHER): Payer: Medicare Other | Admitting: Orthopaedic Surgery

## 2017-04-30 ENCOUNTER — Encounter (INDEPENDENT_AMBULATORY_CARE_PROVIDER_SITE_OTHER): Payer: Self-pay | Admitting: Orthopaedic Surgery

## 2017-04-30 ENCOUNTER — Ambulatory Visit (INDEPENDENT_AMBULATORY_CARE_PROVIDER_SITE_OTHER): Payer: Medicare Other | Admitting: Orthopaedic Surgery

## 2017-04-30 VITALS — BP 139/66 | HR 71 | Resp 16 | Ht 67.0 in | Wt 160.0 lb

## 2017-04-30 DIAGNOSIS — G8929 Other chronic pain: Secondary | ICD-10-CM

## 2017-04-30 DIAGNOSIS — M25561 Pain in right knee: Principal | ICD-10-CM

## 2017-04-30 NOTE — Progress Notes (Signed)
Office Visit Note   Patient: Shari Camacho           Date of Birth: 06/06/1940           MRN: [id number] Visit Date: 04/30/2017              Requested by: Toma DeitersHasanaj, Xaje A, MD [address] [place], [place] [id number] PCP: Toma DeitersHasanaj, Xaje A, MD   Assessment & Plan: Visit Diagnoses:  1. Chronic pain of right knee     Plan: Mrs. Barco had an MRI scan of her right knee demonstrating a complete tear of the posterior root of the medial meniscus.  There are also tricompartmental degenerative changes particularly in the lateral and patellofemoral compartments.  Long discussion regarding the above.  Presently minimally symptomatic.  Will just observe and plan to see her back as needed.  Had prior knee aspiration and cortisone injection which helped.  May take over the counter NSAIDs  Follow-Up Instructions: Return if symptoms worsen or fail to improve.   Orders:  No orders of the defined types were placed in this encounter.  No orders of the defined types were placed in this encounter.     Procedures: No procedures performed   Clinical Data: No additional findings.   Subjective: Chief Complaint  Patient presents with  . Right Knee - Pain  . Knee Pain    mri review  Presently doing well without much pain.  Not having an effusion or significant compromise of her activities related to her knee.  Occasionally will feel some catching or popping along the anterior aspect of her knee possibly related to the osteoarthritis of the patellofemoral joint  HPI  Review of Systems   Objective: Vital Signs: BP 139/66 (BP Location: Right Arm, Patient Position: Sitting, Cuff Size: Normal)   Pulse 71   Resp 16   Ht 5\' 7"  (1.702 m)   Wt 160 lb (72.6 kg)   BMI 25.06 kg/m   Physical Exam  Ortho Exam right knee without effusion.  Mild medial joint pain diffusely.  No lateral joint pain.  Some patellar crepitation.  Full extension and flexed over 100 degrees without instability.  No calf  pain.  No distal edema.  Neurovascular exam intact  Specialty Comments:  No specialty comments available.  Imaging: No results found.   PMFS History: Patient Active Problem List   Diagnosis Date Noted  . Unilateral primary osteoarthritis, right knee 03/21/2017  . Heart palpitations   . Other malaise and fatigue   . Body mass index between 19-24, adult   . Hypothyroidism    Past Medical History:  Diagnosis Date  . Body mass index between 19-24, adult   . Heart palpitations   . High cholesterol   . Hypothyroidism   . Other malaise and fatigue     History reviewed. No pertinent family history.  Past Surgical History:  Procedure Laterality Date  . BACK SURGERY    . CATARACT EXTRACTION     Social History   Occupational History  . Not on file  Tobacco Use  . Smoking status: Never Smoker  . Smokeless tobacco: Never Used  Substance and Sexual Activity  . Alcohol use: No  . Drug use: No  . Sexual activity: Not on file     Valeria BatmanPeter W Murry Khiev, MD   Note - This record has been created using [employer] software.  Chart creation errors have been sought, but may not always  have been located. Such creation errors do  not reflect on  the standard of medical care.

## 2019-01-24 IMAGING — MR MR KNEE*R* W/O CM
4 of 5 series · 18 of 40 positions shown · non-contrast
Comparison: Right knee x-rays dated February 14, 2017.

CLINICAL DATA: Acute right knee pain and swelling.  No injury.

EXAM:
MRI OF THE RIGHT KNEE WITHOUT CONTRAST
TECHNIQUE: Multiplanar, multisequence MR imaging of the knee was performed. No
intravenous contrast was administered.

[Series 3: PD fat-sat · axial · 4.0mm · 0.31mm/px · z∈[-31,+92]mm · 9 of 29 slices shown (1 of 3)]
[im 1/29]
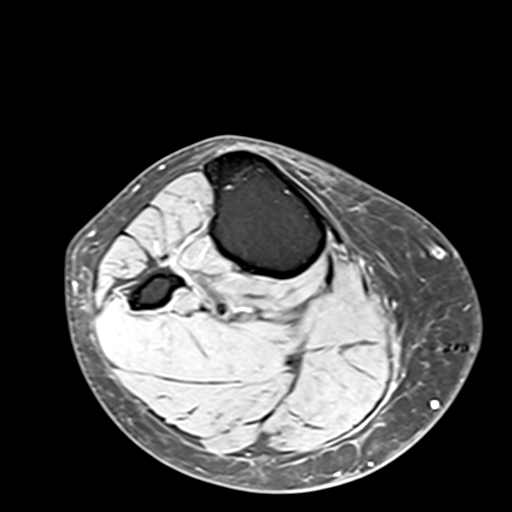
[im 4/29]
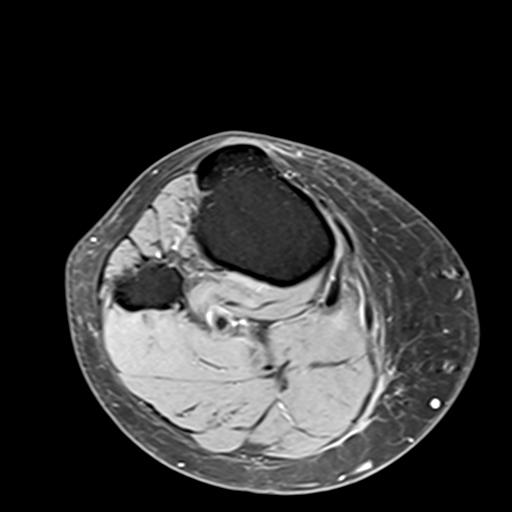
[im 8/29]
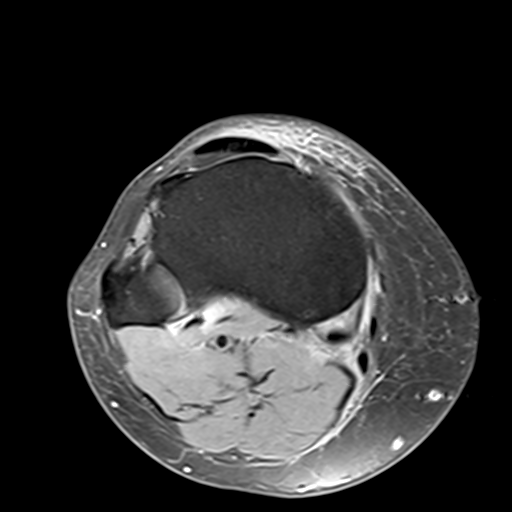
[im 11/29]
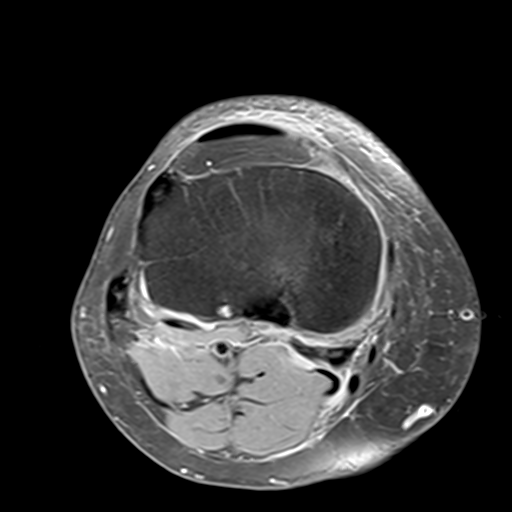
[im 15/29]
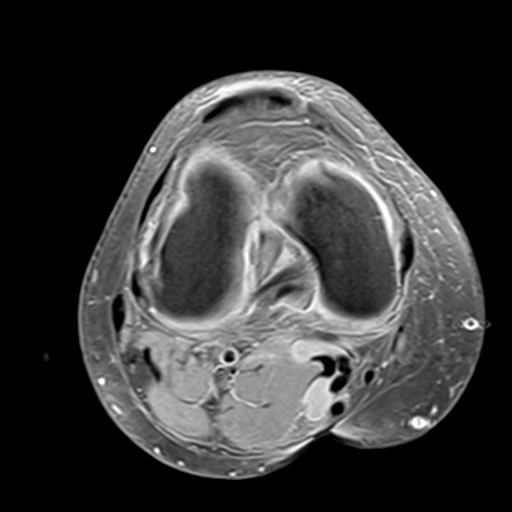
[im 18/29]
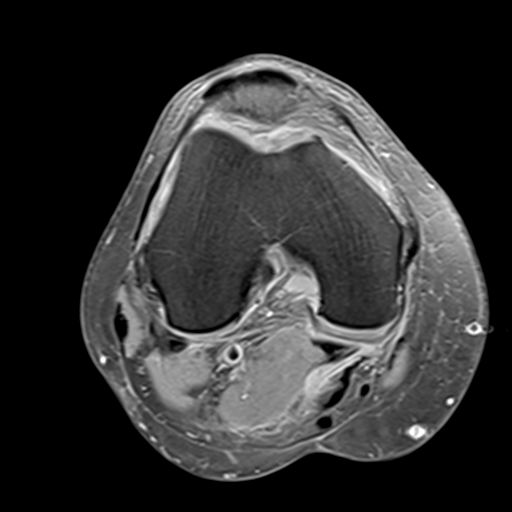
[im 22/29]
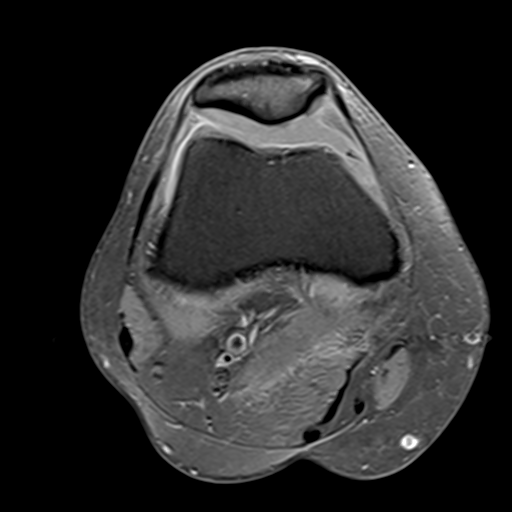
[im 25/29]
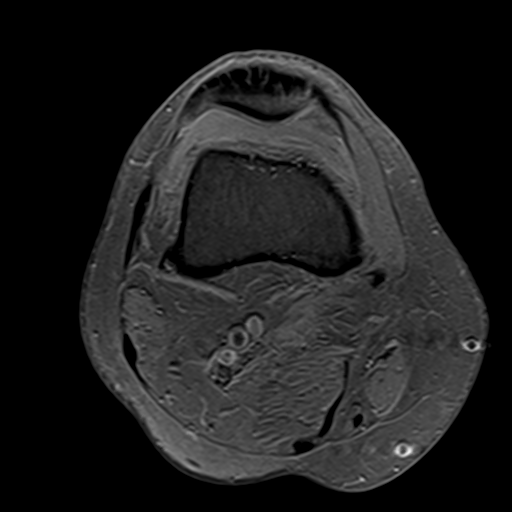
[im 29/29]
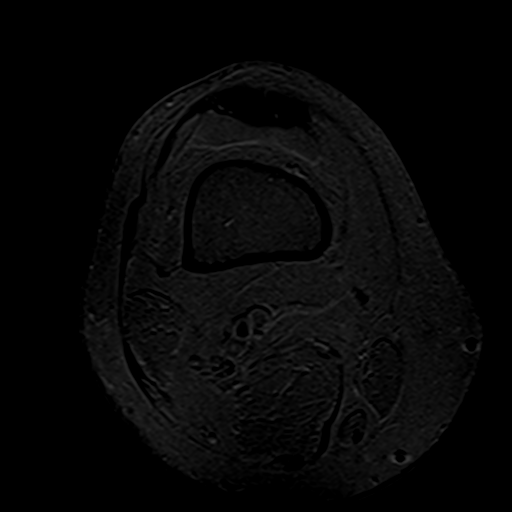

[Series 5: T2 fat-sat · coronal · 4.0mm · 0.31mm/px · 3 of 23 slices shown]
[im 4/23]
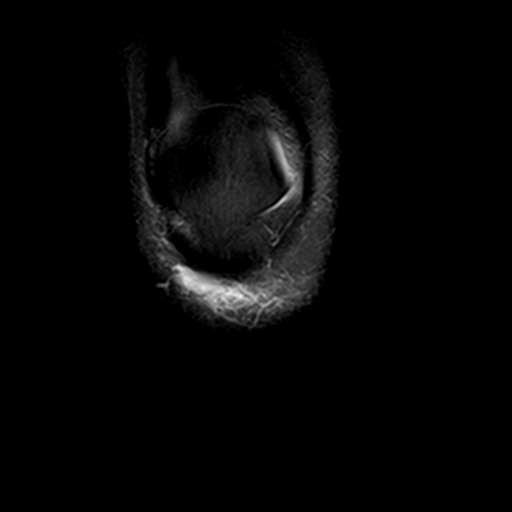
[im 13/23]
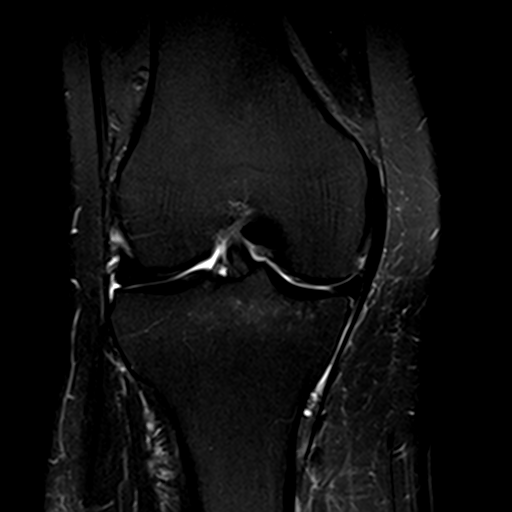
[im 19/23]
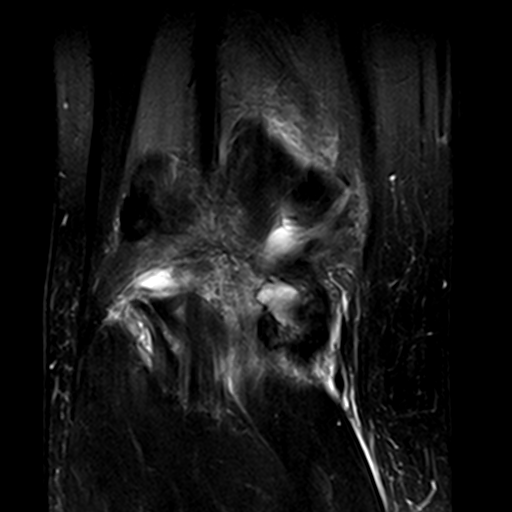

[Series 6: PD fat-sat · coronal · 4.0mm · 0.31mm/px · 3 of 23 slices shown (2 of 3)]
[im 4/23]
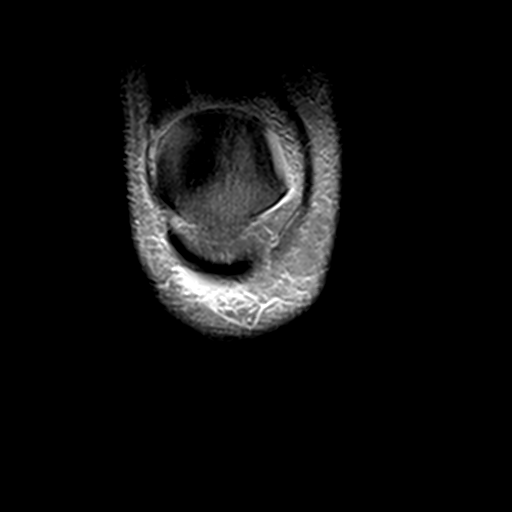
[im 13/23]
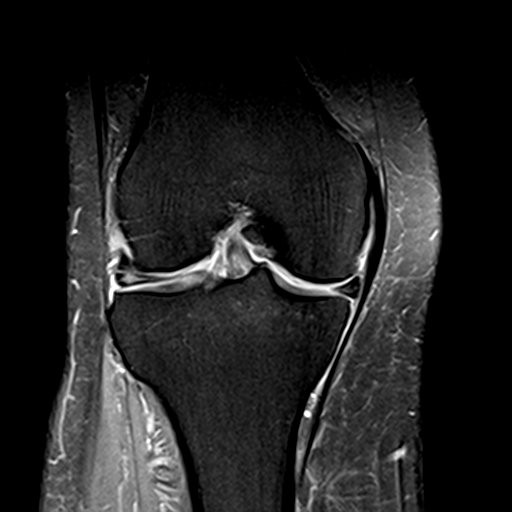
[im 19/23]
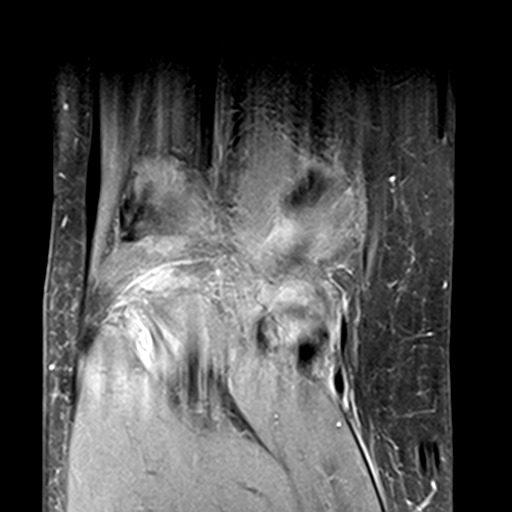

[Series 7: PD fat-sat · sagittal · 4.0mm · 0.31mm/px · 3 of 22 slices shown (3 of 3)]
[im 4/22]
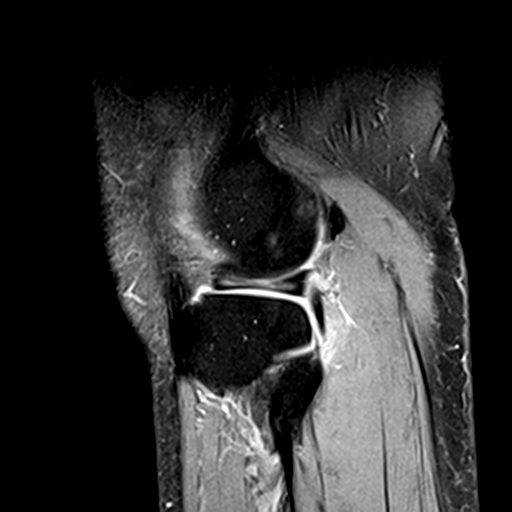
[im 11/22]
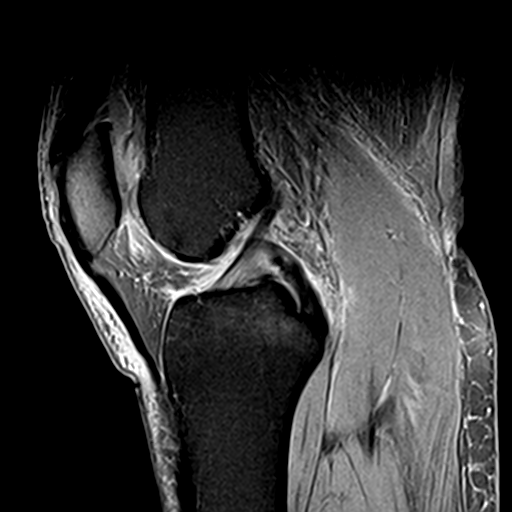
[im 18/22]
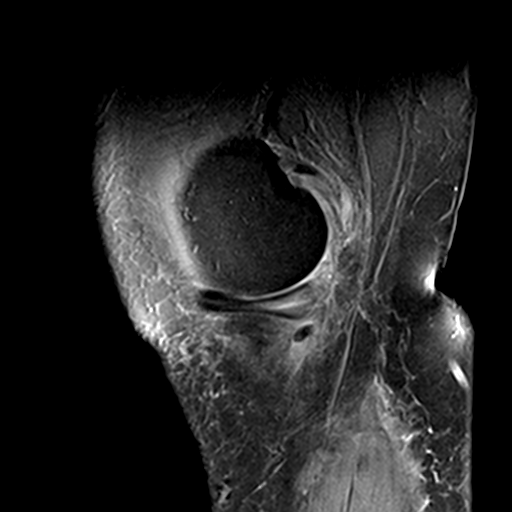

[18 of 40 positions shown; findings below may reference images not displayed]

FINDINGS: MENISCI

Medial meniscus: Complete tear through the posterior root. Prominent
degenerative signal within the posterior horn with horizontal tear.

Lateral meniscus: Degenerative signal throughout the lateral
meniscus without discrete tear.

LIGAMENTS

Cruciates:  Intact ACL and PCL.

Collaterals: Medial collateral ligament is intact. Lateral
collateral ligament complex is intact.

CARTILAGE

Patellofemoral: Large area of full-thickness cartilage loss over the
superior patellar apex. Additional delamination along the medial
patellar facet. Diffuse cartilage thinning over the trochlea with
near full-thickness cartilage loss along the trochlear groove.

Medial: Partial-thickness cartilage loss over the medial femoral
condyle.

Lateral: Mild diffuse thinning. Full-thickness cartilage loss over
the posterior aspect of the lateral tibial plateau with underlying
subchondral cystic change.

Joint: Small joint effusion. Normal Hoffa's fat. No plical
thickening.

Popliteal Fossa:  Small Baker cyst.  Intact popliteus tendon.

Extensor Mechanism: Intact quadriceps tendon and patellar tendon.
Intact medial and lateral patellar retinaculum. Intact MPFL.

Bones: No fracture or dislocation. Faint edema within the tibial
spine near the medial meniscus posterior root attachment is likely
reactive.

Other: None.
IMPRESSION: 1. Complete tear of the medial meniscus posterior root. Additional
horizontal tear within the posterior horn.
2. Prominent degenerative signal throughout the lateral meniscus
without discrete tear.
3. Tricompartmental osteoarthritis as described above, with areas of
full-thickness cartilage loss in the lateral and patellofemoral
compartments.
4. Small joint effusion.  Small Baker cyst.

## 2019-02-05 DIAGNOSIS — M65841 Other synovitis and tenosynovitis, right hand: Secondary | ICD-10-CM | POA: Diagnosis not present

## 2019-02-05 DIAGNOSIS — M65849 Other synovitis and tenosynovitis, unspecified hand: Secondary | ICD-10-CM | POA: Diagnosis not present

## 2019-02-05 DIAGNOSIS — M79641 Pain in right hand: Secondary | ICD-10-CM | POA: Diagnosis not present

## 2019-02-05 DIAGNOSIS — M25562 Pain in left knee: Secondary | ICD-10-CM | POA: Diagnosis not present

## 2019-02-26 DIAGNOSIS — E038 Other specified hypothyroidism: Secondary | ICD-10-CM | POA: Diagnosis not present

## 2019-02-26 DIAGNOSIS — R5383 Other fatigue: Secondary | ICD-10-CM | POA: Diagnosis not present

## 2019-02-26 DIAGNOSIS — Z6824 Body mass index (BMI) 24.0-24.9, adult: Secondary | ICD-10-CM | POA: Diagnosis not present

## 2019-02-26 DIAGNOSIS — E7849 Other hyperlipidemia: Secondary | ICD-10-CM | POA: Diagnosis not present

## 2019-02-26 DIAGNOSIS — I10 Essential (primary) hypertension: Secondary | ICD-10-CM | POA: Diagnosis not present

## 2019-03-09 DIAGNOSIS — M79641 Pain in right hand: Secondary | ICD-10-CM | POA: Diagnosis not present

## 2019-03-09 DIAGNOSIS — M65849 Other synovitis and tenosynovitis, unspecified hand: Secondary | ICD-10-CM | POA: Diagnosis not present

## 2019-05-12 ENCOUNTER — Ambulatory Visit: Payer: Medicare Other | Admitting: Orthopaedic Surgery

## 2019-05-12 ENCOUNTER — Encounter: Payer: Self-pay | Admitting: Orthopaedic Surgery

## 2019-05-12 ENCOUNTER — Other Ambulatory Visit: Payer: Self-pay

## 2019-05-12 ENCOUNTER — Ambulatory Visit (INDEPENDENT_AMBULATORY_CARE_PROVIDER_SITE_OTHER): Payer: Medicare PPO

## 2019-05-12 VITALS — Ht 67.0 in | Wt 157.0 lb

## 2019-05-12 DIAGNOSIS — G8929 Other chronic pain: Secondary | ICD-10-CM

## 2019-05-12 DIAGNOSIS — M1712 Unilateral primary osteoarthritis, left knee: Secondary | ICD-10-CM | POA: Insufficient documentation

## 2019-05-12 DIAGNOSIS — M25562 Pain in left knee: Secondary | ICD-10-CM

## 2019-05-12 MED ORDER — METHYLPREDNISOLONE ACETATE 40 MG/ML IJ SUSP
80.0000 mg | INTRAMUSCULAR | Status: AC | PRN
  Administered 2019-05-12: 80 mg via INTRA_ARTICULAR

## 2019-05-12 MED ORDER — LIDOCAINE HCL 1 % IJ SOLN
2.0000 mL | INTRAMUSCULAR | Status: AC | PRN
  Administered 2019-05-12: 2 mL

## 2019-05-12 MED ORDER — BUPIVACAINE HCL 0.25 % IJ SOLN
2.0000 mL | INTRAMUSCULAR | Status: AC | PRN
  Administered 2019-05-12: 2 mL via INTRA_ARTICULAR

## 2019-05-12 NOTE — Progress Notes (Signed)
Office Visit Note   Patient: Shari Camacho           Date of Birth: 1941-01-22           MRN: [id number] Visit Date: 05/12/2019              Requested by: Toma Deiters, MD [address] [place],  [place] [address] PCP: Toma Deiters, MD   Assessment & Plan: Visit Diagnoses:  1. Chronic pain of left knee   2. Unilateral primary osteoarthritis, left knee     Plan:  #1: Corticosteroid injection to the left knee was accomplished atraumatically #2: If this does not improve her symptoms then we can schedule an MRI scan of the knee.  Follow-Up Instructions: Return if symptoms worsen or fail to improve.   Orders:  Orders Placed This Encounter  Procedures  . XR KNEE 3 VIEW LEFT   No orders of the defined types were placed in this encounter.     Procedures: Large Joint Inj: L knee on 05/12/2019 4:34 PM Indications: pain and diagnostic evaluation Details: 25 G 1.5 in needle, anteromedial approach  Arthrogram: No  Medications: 2 mL lidocaine 1 %; 80 mg methylPREDNISolone acetate 40 MG/ML; 2 mL bupivacaine 0.25 % Procedure, treatment alternatives, risks and benefits explained, specific risks discussed. Consent was given by the patient. Immediately prior to procedure a time out was called to verify the correct patient, procedure, equipment, support staff and site/side marked as required. Patient was prepped and draped in the usual sterile fashion.       Clinical Data: No additional findings.   Subjective: Chief Complaint  Patient presents with  . Left Knee - Pain  Patient presents today for left knee pain. She twisted her knee 5 months ago and has had pain and swelling since. She swelling is usually located more medially, but her pain can be located all throughout. Her pain is not constant, but seems to hurt more with activity. She does not take anything for pain.   HPI  Review of Systems  Constitutional: Negative for fatigue.  HENT: Negative for ear pain.   Eyes:  Negative for pain.  Respiratory: Negative for shortness of breath.   Cardiovascular: Negative for leg swelling.  Gastrointestinal: Negative for constipation and diarrhea.  Endocrine: Negative for cold intolerance and heat intolerance.  Genitourinary: Negative for difficulty urinating.  Musculoskeletal: Negative for joint swelling.  Skin: Negative for rash.  Allergic/Immunologic: Negative for food allergies.  Neurological: Negative for weakness.  Hematological: Does not bruise/bleed easily.  Psychiatric/Behavioral: Negative for sleep disturbance.     Objective: Vital Signs: Ht 5\' 7"  (1.702 m)   Wt 157 lb (71.2 kg)   BMI 24.59 kg/m   Physical Exam Constitutional:      Appearance: She is well-developed.  HENT:     Head: Normocephalic.  Eyes:     Pupils: Pupils are equal, round, and reactive to light.  Pulmonary:     Effort: Pulmonary effort is normal.  Skin:    General: Skin is warm and dry.  Neurological:     Mental Status: She is alert and oriented to person, place, and time.  Psychiatric:        Mood and Affect: Mood normal.        Behavior: Behavior normal.        Thought Content: Thought content normal.     Ortho Exam  Exam today reveals a mild effusion.  Range of motion from few degrees shy of  full extension to 105 degrees.  Mild patellofemoral crepitance with range of motion.  Tender at the medial joint line.  Good motion of both hips.  Neuro vas intact distally.  Specialty Comments:  No specialty comments available.  Imaging: XR KNEE 3 VIEW LEFT  Result Date: 05/12/2019 Three-view x-ray of the left knee reveals some medial translation of the left femur on the tibia.  Periarticular spurring is along the lateral distal femur and proximal tibia.  Some calcification posteriorly in the capsule. Lateral positioning of the patella on the patellofemoral view with some narrowing of the joint space.    PMFS History: Current Outpatient Medications  Medication Sig  Dispense Refill  . aspirin 81 MG tablet Take 81 mg by mouth daily.    Marland Kitchen atorvastatin (LIPITOR) 20 MG tablet 1/2 tab po qd    . Cod Liver Oil (COD LIVER PO) Take 1 tablet by mouth daily.    Marland Kitchen GLUCOSAMINE-CHONDROITIN PO Take 1 tablet by mouth daily.    Marland Kitchen KRILL OIL PO Take 1 tablet by mouth daily.    Marland Kitchen levothyroxine (SYNTHROID) 75 MCG tablet Take by mouth daily before breakfast.     . metoprolol succinate (TOPROL XL) 25 MG 24 hr tablet Take 1 tablet (25 mg total) by mouth daily. 30 tablet 5  . OYSTER SHELL PO Take 1 tablet by mouth daily.    Marland Kitchen triamterene-hydrochlorothiazide (MAXZIDE-25) 37.5-25 MG per tablet Take 1 tablet by mouth daily.     No current facility-administered medications for this visit.    Patient Active Problem List   Diagnosis Date Noted  . Unilateral primary osteoarthritis, left knee 05/12/2019  . Unilateral primary osteoarthritis, right knee 03/21/2017  . Heart palpitations   . Other malaise and fatigue   . Body mass index between 19-24, adult   . Hypothyroidism    Past Medical History:  Diagnosis Date  . Body mass index between 19-24, adult   . Heart palpitations   . High cholesterol   . Hypothyroidism   . Other malaise and fatigue     History reviewed. No pertinent family history.  Past Surgical History:  Procedure Laterality Date  . BACK SURGERY    . CATARACT EXTRACTION     Social History   Occupational History  . Not on file  Tobacco Use  . Smoking status: Never Smoker  . Smokeless tobacco: Never Used  Substance and Sexual Activity  . Alcohol use: No  . Drug use: No  . Sexual activity: Not on file

## 2019-08-26 DIAGNOSIS — E038 Other specified hypothyroidism: Secondary | ICD-10-CM | POA: Diagnosis not present

## 2019-08-26 DIAGNOSIS — Z6825 Body mass index (BMI) 25.0-25.9, adult: Secondary | ICD-10-CM | POA: Diagnosis not present

## 2019-08-26 DIAGNOSIS — Z1331 Encounter for screening for depression: Secondary | ICD-10-CM | POA: Diagnosis not present

## 2019-08-26 DIAGNOSIS — E7849 Other hyperlipidemia: Secondary | ICD-10-CM | POA: Diagnosis not present

## 2019-08-26 DIAGNOSIS — I10 Essential (primary) hypertension: Secondary | ICD-10-CM | POA: Diagnosis not present

## 2019-08-26 DIAGNOSIS — Z0000 Encounter for general adult medical examination without abnormal findings: Secondary | ICD-10-CM | POA: Diagnosis not present

## 2019-12-09 DIAGNOSIS — Z961 Presence of intraocular lens: Secondary | ICD-10-CM | POA: Diagnosis not present

## 2019-12-09 DIAGNOSIS — H524 Presbyopia: Secondary | ICD-10-CM | POA: Diagnosis not present

## 2020-02-04 DIAGNOSIS — Z01419 Encounter for gynecological examination (general) (routine) without abnormal findings: Secondary | ICD-10-CM | POA: Diagnosis not present

## 2020-02-04 DIAGNOSIS — N8111 Cystocele, midline: Secondary | ICD-10-CM | POA: Diagnosis not present

## 2020-02-04 DIAGNOSIS — Z1231 Encounter for screening mammogram for malignant neoplasm of breast: Secondary | ICD-10-CM | POA: Diagnosis not present

## 2020-02-04 DIAGNOSIS — Z124 Encounter for screening for malignant neoplasm of cervix: Secondary | ICD-10-CM | POA: Diagnosis not present

## 2020-02-04 DIAGNOSIS — Z01411 Encounter for gynecological examination (general) (routine) with abnormal findings: Secondary | ICD-10-CM | POA: Diagnosis not present

## 2020-02-04 DIAGNOSIS — Z6826 Body mass index (BMI) 26.0-26.9, adult: Secondary | ICD-10-CM | POA: Diagnosis not present

## 2020-02-04 DIAGNOSIS — N819 Female genital prolapse, unspecified: Secondary | ICD-10-CM | POA: Diagnosis not present

## 2020-02-04 DIAGNOSIS — Z4689 Encounter for fitting and adjustment of other specified devices: Secondary | ICD-10-CM | POA: Diagnosis not present

## 2020-02-25 DIAGNOSIS — E7849 Other hyperlipidemia: Secondary | ICD-10-CM | POA: Diagnosis not present

## 2020-02-25 DIAGNOSIS — Z6826 Body mass index (BMI) 26.0-26.9, adult: Secondary | ICD-10-CM | POA: Diagnosis not present

## 2020-02-25 DIAGNOSIS — I10 Essential (primary) hypertension: Secondary | ICD-10-CM | POA: Diagnosis not present

## 2020-02-25 DIAGNOSIS — E038 Other specified hypothyroidism: Secondary | ICD-10-CM | POA: Diagnosis not present

## 2020-02-25 DIAGNOSIS — Z0000 Encounter for general adult medical examination without abnormal findings: Secondary | ICD-10-CM | POA: Diagnosis not present

## 2020-02-25 DIAGNOSIS — Z1331 Encounter for screening for depression: Secondary | ICD-10-CM | POA: Diagnosis not present

## 2020-03-03 ENCOUNTER — Encounter: Payer: Self-pay | Admitting: Internal Medicine

## 2020-04-20 ENCOUNTER — Telehealth (INDEPENDENT_AMBULATORY_CARE_PROVIDER_SITE_OTHER): Payer: Medicare PPO | Admitting: Gastroenterology

## 2020-04-20 ENCOUNTER — Telehealth: Payer: Self-pay | Admitting: Gastroenterology

## 2020-04-20 ENCOUNTER — Telehealth: Payer: Self-pay | Admitting: *Deleted

## 2020-04-20 ENCOUNTER — Other Ambulatory Visit: Payer: Self-pay

## 2020-04-20 ENCOUNTER — Encounter: Payer: Self-pay | Admitting: Gastroenterology

## 2020-04-20 DIAGNOSIS — Z86010 Personal history of colonic polyps: Secondary | ICD-10-CM | POA: Diagnosis not present

## 2020-04-20 MED ORDER — PEG 3350-KCL-NA BICARB-NACL 420 G PO SOLR: 4000.0000 mL | ORAL | 0 refills | Status: DC

## 2020-04-20 NOTE — Telephone Encounter (Signed)
Pt consented to a virtual visit today.   

## 2020-04-20 NOTE — Progress Notes (Signed)
Primary Care Physician:  Toma Deiters, MD Primary GI:  Roetta Sessions, MD   Patient Location: Home  Provider Location: [facility] office  Reason for Visit:  Chief Complaint  Patient presents with  . Consult    TCS last done 2014 or 2017 last done in [place].     Persons present on the virtual encounter, with roles: Patient, myself (provider),Mindy Estudillo CMA (updated meds and allergies)  Total time (minutes) spent on medical discussion: 15 minutes  Due to COVID-19, visit was conducted using Mychart video method.  Visit was requested by patient.  Virtual Visit via Mychart video  I connected with Shari Camacho on 04/20/20 at 11:00 AM EDT by Mychart video and verified that I am speaking with the correct person using two identifiers.   I discussed the limitations, risks, security and privacy concerns of performing an evaluation and management service by telephone/video and the availability of in person appointments. I also discussed with the patient that there may be a patient responsible charge related to this service. The patient expressed understanding and agreed to proceed.   HPI:   Shari Camacho is a 80 y.o. female who presents for virtual visit regarding consult colonoscopy at the request of Dr. Olena Leatherwood. Patient states her last colonoscopy was prior to 2017 by Dr. Teena Dunk. She has had several colonoscopies and has history of polyps. States she is due for five year follow up.   Clinically doing well. No bowel concerns. No melena, brbpr. No abdominal pain or UGI symptoms. She is not aware of any FH of colon polyps or colon cancer but notes that she knows nothing about her father's side of the family as he passed when she was 85 months old.   She is very healthy for her age. Remains very active. Her PCP is encouraging her to have one last colonoscopy and she is agreeable.    Current Outpatient Medications  Medication Sig Dispense Refill  . aspirin 81 MG tablet Take 81 mg by  mouth daily.    Marland Kitchen atorvastatin (LIPITOR) 20 MG tablet Take 20 mg by mouth daily.    . Cholecalciferol (D3-1000 PO) Take 2 tablets by mouth daily.    Marland Kitchen GLUCOSAMINE-CHONDROITIN PO Take 1 tablet by mouth daily. 1500mg  daily    . Grape Seed OIL by Does not apply route. 400mg  daily    . KRILL OIL PO Take 1 tablet by mouth daily. 1000mg  daily    . levothyroxine (SYNTHROID) 88 MCG tablet Take 88 mcg by mouth daily before breakfast.    . metoprolol succinate (TOPROL XL) 25 MG 24 hr tablet Take 1 tablet (25 mg total) by mouth daily. 30 tablet 5  . triamterene-hydrochlorothiazide (MAXZIDE-25) 37.5-25 MG per tablet Take 1 tablet by mouth daily.     No current facility-administered medications for this visit.    Past Medical History:  Diagnosis Date  . Body mass index between 19-24, adult   . Heart palpitations   . High cholesterol   . Hypothyroidism   . Other malaise and fatigue     Past Surgical History:  Procedure Laterality Date  . BACK SURGERY    . CATARACT EXTRACTION      Family History  Problem Relation Age of Onset  . Diverticulitis Mother   . Other Other        does not know father's medical history  . Colon cancer Neg Hx     Social History   Socioeconomic History  . Marital  status: Married    Spouse name: Not on file  . Number of children: Not on file  . Years of education: Not on file  . Highest education level: Not on file  Occupational History  . Not on file  Tobacco Use  . Smoking status: Never Smoker  . Smokeless tobacco: Never Used  Vaping Use  . Vaping Use: Never used  Substance and Sexual Activity  . Alcohol use: No  . Drug use: No  . Sexual activity: Not on file  Other Topics Concern  . Not on file  Social History Narrative   ** Merged History Encounter **       Social Determinants of Health   Financial Resource Strain: Not on file  Food Insecurity: Not on file  Transportation Needs: Not on file  Physical Activity: Not on file  Stress: Not on  file  Social Connections: Not on file  Intimate Partner Violence: Not on file      ROS:  General: Negative for anorexia, weight loss, fever, chills, fatigue, weakness. Eyes: Negative for vision changes.  ENT: Negative for hoarseness, difficulty swallowing , nasal congestion. CV: Negative for chest pain, angina, palpitations, dyspnea on exertion, peripheral edema.  Respiratory: Negative for dyspnea at rest, dyspnea on exertion, cough, sputum, wheezing.  GI: See history of present illness. GU:  Negative for dysuria, hematuria, urinary incontinence, urinary frequency, nocturnal urination.  MS: Negative for joint pain, low back pain.  Derm: Negative for rash or itching.  Neuro: Negative for weakness, abnormal sensation, seizure, frequent headaches, memory loss, confusion.  Psych: Negative for anxiety, depression, suicidal ideation, hallucinations.  Endo: Negative for unusual weight change.  Heme: Negative for bruising or bleeding. Allergy: Negative for rash or hives.   Observations/Objective: Very pleasant female in no acute distress. Cooperative. Alert and oriented. No shortness of breath. Otherwise exam unavailable.   Assessment and Plan: Pleasant 80 y/o female with history of colon polyps presenting for surveillance colonoscopy. She states it has been at least 5-8 years since her last colonoscopy. She says that she is overdue for surveillance due to her h/o polyps. PCP encouraging her to complete another colonoscopy and she is agreeable. She denies GI symptoms.   Colonoscopy with Dr. Jena Gauss (patient requests Dr. Jena Gauss) in the near future. Conscious sedation. ASA II.  I have discussed the risks, alternatives, benefits with regards to but not limited to the risk of reaction to medication, bleeding, infection, perforation and the patient is agreeable to proceed. Written consent to be obtained.   Follow Up Instructions:    I discussed the assessment and treatment plan with the patient.  The patient was provided an opportunity to ask questions and all were answered. The patient agreed with the plan and demonstrated an understanding of the instructions. AVS mailed to patient's home address.   The patient was advised to call back or seek an in-person evaluation if the symptoms worsen or if the condition fails to improve as anticipated.  I provided 15 minutes of virtual face-to-face time during this encounter.   Tana Coast, PA-C

## 2020-04-20 NOTE — Telephone Encounter (Signed)
Patient seen virtually today.  Please schedule surveillance colonoscopy with [facility] only (patient preference). Conscious sedation. ASA II.   Please mail AVS to patient.

## 2020-04-20 NOTE — Telephone Encounter (Signed)
Shari Camacho, you are scheduled for a virtual visit with your provider today.  Just as we do with appointments in the office, we must obtain your consent to participate.  Your consent will be active for this visit and any virtual visit you may have with one of our providers in the next 365 days.  If you have a MyChart account, I can also send a copy of this consent to you electronically.  All virtual visits are billed to your insurance company just like a traditional visit in the office.  As this is a virtual visit, video technology does not allow for your provider to perform a traditional examination.  This may limit your provider's ability to fully assess your condition.  If your provider identifies any concerns that need to be evaluated in person or the need to arrange testing such as labs, EKG, etc, we will make arrangements to do so.  Although advances in technology are sophisticated, we cannot ensure that it will always work on either your end or our end.  If the connection with a video visit is poor, we may have to switch to a telephone visit.  With either a video or telephone visit, we are not always able to ensure that we have a secure connection.   I need to obtain your verbal consent now.   Are you willing to proceed with your visit today?

## 2020-04-20 NOTE — Telephone Encounter (Signed)
Tried to call pt, LMOVM for return call. 

## 2020-04-20 NOTE — Telephone Encounter (Signed)
Spoke to pt, TCS scheduled for 06/15/20--PM. COVID test 06/13/20 at 1:15pm. Rx for prep sent to pharmacy. Orders entered. Appt letter mailed with procedure instructions.

## 2020-04-20 NOTE — Patient Instructions (Signed)
Colonoscopy as scheduled. See separate instructions.  

## 2020-04-20 NOTE — Telephone Encounter (Signed)
PA for TCS submitted via [employer] website. [employer]# [id number], valid 06/15/20-07/15/20.

## 2020-06-13 ENCOUNTER — Other Ambulatory Visit: Payer: Self-pay

## 2020-06-13 ENCOUNTER — Other Ambulatory Visit (HOSPITAL_COMMUNITY)
Admission: RE | Admit: 2020-06-13 | Discharge: 2020-06-13 | Disposition: A | Discharge status: Home / Self Care | Payer: Medicare PPO | Source: Ambulatory Visit | Attending: Internal Medicine | Admitting: Internal Medicine

## 2020-06-13 DIAGNOSIS — Z01812 Encounter for preprocedural laboratory examination: Secondary | ICD-10-CM | POA: Insufficient documentation

## 2020-06-13 DIAGNOSIS — Z20822 Contact with and (suspected) exposure to covid-19: Secondary | ICD-10-CM | POA: Diagnosis not present

## 2020-06-13 LAB — SARS CORONAVIRUS 2 (TAT 6-24 HRS): SARS Coronavirus 2: NEGATIVE

## 2020-06-15 ENCOUNTER — Ambulatory Visit (HOSPITAL_COMMUNITY)
Admission: RE | Admit: 2020-06-15 | Discharge: 2020-06-15 | Disposition: A | Discharge status: Home / Self Care | Payer: Medicare PPO | Attending: Internal Medicine | Admitting: Internal Medicine

## 2020-06-15 ENCOUNTER — Other Ambulatory Visit: Payer: Self-pay

## 2020-06-15 ENCOUNTER — Encounter (HOSPITAL_COMMUNITY): Payer: Self-pay | Admitting: Internal Medicine

## 2020-06-15 ENCOUNTER — Encounter (HOSPITAL_COMMUNITY)
Admission: RE | Disposition: A | Discharge status: Home / Self Care | Payer: Self-pay | Source: Home / Self Care | Attending: Internal Medicine

## 2020-06-15 DIAGNOSIS — Z79899 Other long term (current) drug therapy: Secondary | ICD-10-CM | POA: Diagnosis not present

## 2020-06-15 DIAGNOSIS — Z8379 Family history of other diseases of the digestive system: Secondary | ICD-10-CM | POA: Diagnosis not present

## 2020-06-15 DIAGNOSIS — Z9049 Acquired absence of other specified parts of digestive tract: Secondary | ICD-10-CM | POA: Insufficient documentation

## 2020-06-15 DIAGNOSIS — Z79890 Hormone replacement therapy (postmenopausal): Secondary | ICD-10-CM | POA: Insufficient documentation

## 2020-06-15 DIAGNOSIS — Z1211 Encounter for screening for malignant neoplasm of colon: Secondary | ICD-10-CM | POA: Diagnosis not present

## 2020-06-15 DIAGNOSIS — Q438 Other specified congenital malformations of intestine: Secondary | ICD-10-CM | POA: Diagnosis not present

## 2020-06-15 DIAGNOSIS — E7800 Pure hypercholesterolemia, unspecified: Secondary | ICD-10-CM | POA: Diagnosis not present

## 2020-06-15 DIAGNOSIS — E039 Hypothyroidism, unspecified: Secondary | ICD-10-CM | POA: Diagnosis not present

## 2020-06-15 DIAGNOSIS — Z7982 Long term (current) use of aspirin: Secondary | ICD-10-CM | POA: Insufficient documentation

## 2020-06-15 DIAGNOSIS — Z86010 Personal history of colonic polyps: Secondary | ICD-10-CM | POA: Insufficient documentation

## 2020-06-15 HISTORY — PX: COLONOSCOPY: SHX5424

## 2020-06-15 SURGERY — COLONOSCOPY
Anesthesia: Moderate Sedation

## 2020-06-15 MED ORDER — SODIUM CHLORIDE 0.9 % IV SOLN: INTRAVENOUS | Status: DC

## 2020-06-15 MED ORDER — ONDANSETRON HCL 4 MG/2ML IJ SOLN
INTRAMUSCULAR | Status: AC
  Filled 2020-06-15: qty 2

## 2020-06-15 MED ORDER — ONDANSETRON HCL 4 MG/2ML IJ SOLN
INTRAMUSCULAR | Status: DC | PRN
  Administered 2020-06-15: 4 mg via INTRAVENOUS

## 2020-06-15 MED ORDER — MIDAZOLAM HCL 5 MG/5ML IJ SOLN
INTRAMUSCULAR | Status: AC
  Filled 2020-06-15: qty 10

## 2020-06-15 MED ORDER — MIDAZOLAM HCL 5 MG/5ML IJ SOLN
INTRAMUSCULAR | Status: DC | PRN
  Administered 2020-06-15: 2 mg via INTRAVENOUS
  Administered 2020-06-15 (×2): 1 mg via INTRAVENOUS

## 2020-06-15 MED ORDER — MEPERIDINE HCL 50 MG/ML IJ SOLN
INTRAMUSCULAR | Status: AC
  Filled 2020-06-15: qty 1

## 2020-06-15 MED ORDER — MEPERIDINE HCL 100 MG/ML IJ SOLN
INTRAMUSCULAR | Status: DC | PRN
  Administered 2020-06-15: 40 mg via INTRAVENOUS

## 2020-06-15 MED ORDER — STERILE WATER FOR IRRIGATION IR SOLN
Status: DC | PRN
  Administered 2020-06-15: 1.5 mL

## 2020-06-15 NOTE — Op Note (Signed)
[facility] Patient Name: Shari Camacho Procedure Date: 06/15/2020 12:27 PM MRN: [id number] Date of Birth: 1940-08-11 Attending MD: Gennette Pac , MD CSN: [id number] Age: 80 Admit Type: Outpatient Procedure:                Colonoscopy Indications:              High risk colon cancer surveillance: Personal                            history of colonic polyps Providers:                Gennette Pac, MD, Buel Ream. Thomasena Edis RN, RN,                            Pandora Leiter, Technician Referring MD:              Medicines:                Midazolam 4 mg IV, Meperidine 40 mg IV Complications:            No immediate complications. Estimated Blood Loss:     Estimated blood loss: none. Procedure:                Pre-Anesthesia Assessment:                           - Prior to the procedure, a History and Physical                            was performed, and patient medications and                            allergies were reviewed. The patient's tolerance of                            previous anesthesia was also reviewed. The risks                            and benefits of the procedure and the sedation                            options and risks were discussed with the patient.                            All questions were answered, and informed consent                            was obtained. Prior Anticoagulants: The patient has                            taken no previous anticoagulant or antiplatelet                            agents. ASA Grade Assessment: II - A patient with  mild systemic disease. After reviewing the risks                            and benefits, the patient was deemed in                            satisfactory condition to undergo the procedure.                           After obtaining informed consent, the colonoscope                            was passed under direct vision. Throughout the                             procedure, the patient's blood pressure, pulse, and                            oxygen saturations were monitored continuously. The                            CF-[id number] ([id number] scope was introduced through                            the anus and advanced to the the cecum, identified                            by appendiceal orifice and ileocecal valve. The                            colonoscopy was performed without difficulty. The                            patient tolerated the procedure well. The quality                            of the bowel preparation was adequate. Scope In: 1:14:11 PM Scope Out: 1:34:01 PM Scope Withdrawal Time: 0 hours 6 minutes 20 seconds  Total Procedure Duration: 0 hours 19 minutes 50 seconds  Findings:      The perianal and digital rectal examinations were normal. Elongated and       redundant colon requiring changing of the patient's position and       external abdominal pressure to reach the cecum.      The colon (entire examined portion) appeared normal.      The retroflexed view of the distal rectum and anal verge was normal and       showed no anal or rectal abnormalities. Impression:               - The entire examined colon is normal (redundant                            and elongated).                           -  The distal rectum and anal verge are normal on                            retroflexion view.                           - No specimens collected. Moderate Sedation:      Moderate (conscious) sedation was administered by the endoscopy nurse       and supervised by the endoscopist. The following parameters were       monitored: oxygen saturation, heart rate, blood pressure, respiratory       rate, EKG, adequacy of pulmonary ventilation, and response to care. Recommendation:           - Patient has a contact number available for                            emergencies. The signs and symptoms of potential                            delayed  complications were discussed with the                            patient. Return to normal activities tomorrow.                            Written discharge instructions were provided to the                            patient.                           - Resume previous diet.                           - Continue present medications.                           - No repeat colonoscopy.                           - Return to GI clinic (date not yet determined). Procedure Code(s):        --- Professional ---                           [phone], Colonoscopy, flexible; diagnostic, including                            collection of specimen(s) by brushing or washing,                            when performed (separate procedure) Diagnosis Code(s):        --- Professional ---                           Z86.010, Personal history of colonic polyps CPT copyright 2019 American Medical Association. All rights reserved. The codes documented  in this report are preliminary and upon coder review may  be revised to meet current compliance requirements. Gerrit Friends. Iona Stay, MD Gennette Pac, MD 06/15/2020 1:39:27 PM This report has been signed electronically. Number of Addenda: 0

## 2020-06-15 NOTE — H&P (Signed)
@LOGO @   Primary Care Physician:  , MD Primary Gastroenterologist:  Dr. Toma Deiters  Pre-Procedure History & Physical: HPI:  Shari Camacho is a 80 y.o. female here for for surveillance colonoscopy.  History of multiple colonic adenomas removed over time.  No bowel symptoms currently.  Past Medical History:  Diagnosis Date  . Body mass index between 19-24, adult   . Heart palpitations   . High cholesterol   . Hypothyroidism   . Other malaise and fatigue     Past Surgical History:  Procedure Laterality Date  . BACK SURGERY    . CATARACT EXTRACTION    . CHOLECYSTECTOMY      Prior to Admission medications   Medication Sig Start Date End Date Taking? Authorizing Provider  atorvastatin (LIPITOR) 20 MG tablet Take 20 mg by mouth daily after breakfast.   Yes [provider]  Cholecalciferol (VITAMIN D3) 50 MCG (2000 UT) TABS Take 2,000 Units by mouth in the morning.   Yes [provider]  Glucosamine Sulfate-MSM (GLUCOSAMINE-MSM DS PO) Take 1 tablet by mouth in the morning.   Yes [provider]  GRAPE SEED EXTRACT PO Take 400 mg by mouth in the morning.   Yes [provider]  HM ASPIRIN EC LOW DOSE 81 MG EC tablet Take 81 mg by mouth daily. 01/21/20  Yes [provider]  01/23/20 Oil 500 MG CAPS Take 1,000 mg by mouth in the morning.   Yes [provider]  levothyroxine (SYNTHROID) 88 MCG tablet Take 88 mcg by mouth daily before breakfast. 12/10/12  Yes [provider]  metoprolol succinate (TOPROL XL) 25 MG 24 hr tablet Take 1 tablet (25 mg total) by mouth daily. Patient taking differently: Take 25 mg by mouth every evening. 03/02/13  Yes 04/30/13, MD  polyethylene glycol-electrolytes (TRILYTE) 420 g solution Take 4,000 mLs by mouth as directed. 04/20/20  Yes Nejla Reasor, 04/22/20, MD  triamterene-hydrochlorothiazide (MAXZIDE-25) 37.5-25 MG per tablet Take 1 tablet by mouth in the morning. 12/29/12  Yes [provider]    Allergies as of 04/20/2020  . (No Known Allergies)    Family History  Problem Relation Age of Onset  . Diverticulitis Mother   . Other Other        does not know father's medical history  . Colon cancer Neg Hx     Social History   Socioeconomic History  . Marital status: Married    Spouse name: Not on file  . Number of children: Not on file  . Years of education: Not on file  . Highest education level: Not on file  Occupational History  . Not on file  Tobacco Use  . Smoking status: Never Smoker  . Smokeless tobacco: Never Used  Vaping Use  . Vaping Use: Never used  Substance and Sexual Activity  . Alcohol use: No  . Drug use: No  . Sexual activity: Not on file  Other Topics Concern  . Not on file  Social History Narrative   ** Merged History Encounter **       Social Determinants of Health   Financial Resource Strain: Not on file  Food Insecurity: Not on file  Transportation Needs: Not on file  Physical Activity: Not on file  Stress: Not on file  Social Connections: Not on file  Intimate Partner Violence: Not on file    Review of Systems: See HPI, otherwise negative ROS  Physical Exam: BP (!) 143/70   Temp  97.9 F (36.6 C) (Oral)   Resp 19   Ht 5\' 7"  (1.702 m)   Wt 71.2 kg   SpO2 96%   BMI 24.59 kg/m  General:   Alert,  Well-developed, well-nourished, pleasant and cooperative in NAD Neck:  Supple; no masses or thyromegaly. No significant cervical adenopathy. Lungs:  Clear throughout to auscultation.   No wheezes, crackles, or rhonchi. No acute distress. Heart:  Regular rate and rhythm; no murmurs, clicks, rubs,  or gallops. Abdomen: Non-distended, normal bowel sounds.  Soft and nontender without appreciable mass or hepatosplenomegaly.  Impression/Plan: 80 year old lady in generally very good health with a history of multiple colonic adenomas removed over multiple colonoscopies here for surveillance examination.  I have offered her  surveillance colonoscopy today. The risks, benefits, limitations, alternatives and imponderables have been reviewed with the patient. Questions have been answered. All parties are agreeable.      Notice: This dictation was prepared with Dragon dictation along with smaller phrase technology. Any transcriptional errors that result from this process are unintentional and may not be corrected upon review.

## 2020-06-15 NOTE — Discharge Instructions (Addendum)
Colonoscopy Discharge Instructions  Read the instructions outlined below and refer to this sheet in the next few weeks. These discharge instructions provide you with general information on caring for yourself after you leave the hospital. Your doctor may also give you specific instructions. While your treatment has been planned according to the most current medical practices available, unavoidable complications occasionally occur. If you have any problems or questions after discharge, call Dr. Jena Gauss at [phone]. ACTIVITY  You may resume your regular activity, but move at a slower pace for the next 24 hours.   Take frequent rest periods for the next 24 hours.   Walking will help get rid of the air and reduce the bloated feeling in your belly (abdomen).   No driving for 24 hours (because of the medicine (anesthesia) used during the test).    Do not sign any important legal documents or operate any machinery for 24 hours (because of the anesthesia used during the test).  NUTRITION  Drink plenty of fluids.   You may resume your normal diet as instructed by your doctor.   Begin with a light meal and progress to your normal diet. Heavy or fried foods are harder to digest and may make you feel sick to your stomach (nauseated).   Avoid alcoholic beverages for 24 hours or as instructed.  MEDICATIONS  You may resume your normal medications unless your doctor tells you otherwise.  WHAT YOU CAN EXPECT TODAY  Some feelings of bloating in the abdomen.   Passage of more gas than usual.   Spotting of blood in your stool or on the toilet paper.  IF YOU HAD POLYPS REMOVED DURING THE COLONOSCOPY:  No aspirin products for 7 days or as instructed.   No alcohol for 7 days or as instructed.   Eat a soft diet for the next 24 hours.  FINDING OUT THE RESULTS OF YOUR TEST Not all test results are available during your visit. If your test results are not back during the visit, make an appointment  with your caregiver to find out the results. Do not assume everything is normal if you have not heard from your caregiver or the medical facility. It is important for you to follow up on all of your test results.  SEEK IMMEDIATE MEDICAL ATTENTION IF:  You have more than a spotting of blood in your stool.   Your belly is swollen (abdominal distention).   You are nauseated or vomiting.   You have a temperature over 101.   You have abdominal pain or discomfort that is severe or gets worse throughout the day.    No polyps found today  A future colonoscopy is not recommended unless new symptoms develop   Monitored Anesthesia Care, Care After This sheet gives you information about how to care for yourself after your procedure. Your health care provider may also give you more specific instructions. If you have problems or questions, contact your health care provider. What can I expect after the procedure? After the procedure, it is common to have:  Tiredness.  Forgetfulness about what happened after the procedure.  Impaired judgment for important decisions.  Nausea or vomiting.  Some difficulty with balance. Follow these instructions at home: For the time period you were told by your health care provider:  Rest as needed.  Do not participate in activities where you could fall or become injured.  Do not drive or use machinery.  Do not drink alcohol.  Do not take sleeping pills  or medicines that cause drowsiness.  Do not make important decisions or sign legal documents.  Do not take care of children on your own.      Eating and drinking  Follow the diet that is recommended by your health care provider.  Drink enough fluid to keep your urine pale yellow.  If you vomit: ? Drink water, juice, or soup when you can drink without vomiting. ? Make sure you have little or no nausea before eating solid foods. General instructions  Have a responsible adult stay with you for  the time you are told. It is important to have someone help care for you until you are awake and alert.  Take over-the-counter and prescription medicines only as told by your health care provider.  If you have sleep apnea, surgery and certain medicines can increase your risk for breathing problems. Follow instructions from your health care provider about wearing your sleep device: ? Anytime you are sleeping, including during daytime naps. ? While taking prescription pain medicines, sleeping medicines, or medicines that make you drowsy.  Avoid smoking.  Keep all follow-up visits as told by your health care provider. This is important. Contact a health care provider if:  You keep feeling nauseous or you keep vomiting.  You feel light-headed.  You are still sleepy or having trouble with balance after 24 hours.  You develop a rash.  You have a fever.  You have redness or swelling around the IV site. Get help right away if:  You have trouble breathing.  You have new-onset confusion at home. Summary  For several hours after your procedure, you may feel tired. You may also be forgetful and have poor judgment.  Have a responsible adult stay with you for the time you are told. It is important to have someone help care for you until you are awake and alert.  Rest as told. Do not drive or operate machinery. Do not drink alcohol or take sleeping pills.  Get help right away if you have trouble breathing, or if you suddenly become confused. This information is not intended to replace advice given to you by your health care provider. Make sure you discuss any questions you have with your health care provider. Document Revised: 09/24/2019 Document Reviewed: 12/11/2018 Elsevier Patient Education  2021 [employer].

## 2020-06-23 ENCOUNTER — Encounter (HOSPITAL_COMMUNITY): Payer: Self-pay | Admitting: Internal Medicine

## 2020-08-25 DIAGNOSIS — Z6825 Body mass index (BMI) 25.0-25.9, adult: Secondary | ICD-10-CM | POA: Diagnosis not present

## 2020-08-25 DIAGNOSIS — Z0000 Encounter for general adult medical examination without abnormal findings: Secondary | ICD-10-CM | POA: Diagnosis not present

## 2020-08-25 DIAGNOSIS — I10 Essential (primary) hypertension: Secondary | ICD-10-CM | POA: Diagnosis not present

## 2020-08-25 DIAGNOSIS — E7849 Other hyperlipidemia: Secondary | ICD-10-CM | POA: Diagnosis not present

## 2020-08-25 DIAGNOSIS — E038 Other specified hypothyroidism: Secondary | ICD-10-CM | POA: Diagnosis not present

## 2020-12-08 DIAGNOSIS — H524 Presbyopia: Secondary | ICD-10-CM | POA: Diagnosis not present

## 2020-12-08 DIAGNOSIS — Z961 Presence of intraocular lens: Secondary | ICD-10-CM | POA: Diagnosis not present

## 2021-01-03 DIAGNOSIS — Z780 Asymptomatic menopausal state: Secondary | ICD-10-CM | POA: Diagnosis not present

## 2021-01-03 DIAGNOSIS — M810 Age-related osteoporosis without current pathological fracture: Secondary | ICD-10-CM | POA: Diagnosis not present

## 2021-02-28 ENCOUNTER — Ambulatory Visit: Payer: Medicare PPO | Admitting: Orthopaedic Surgery

## 2021-02-28 ENCOUNTER — Other Ambulatory Visit: Payer: Self-pay

## 2021-02-28 ENCOUNTER — Encounter: Payer: Self-pay | Admitting: Orthopaedic Surgery

## 2021-02-28 ENCOUNTER — Ambulatory Visit (INDEPENDENT_AMBULATORY_CARE_PROVIDER_SITE_OTHER): Payer: Medicare PPO

## 2021-02-28 DIAGNOSIS — M25562 Pain in left knee: Secondary | ICD-10-CM

## 2021-02-28 DIAGNOSIS — G8929 Other chronic pain: Secondary | ICD-10-CM

## 2021-02-28 DIAGNOSIS — M1712 Unilateral primary osteoarthritis, left knee: Secondary | ICD-10-CM

## 2021-02-28 NOTE — Progress Notes (Signed)
Office Visit Note   Patient: Shari Camacho           Date of Birth: 1940-02-04           MRN: [id number] Visit Date: 02/28/2021              Requested by: Toma Deiters, MD [address] [place],  [place] [address] PCP: Toma Deiters, MD   Assessment & Plan: Visit Diagnoses:  1. Chronic pain of left knee   2. Unilateral primary osteoarthritis, left knee     Plan: Recurrent symptoms of osteoarthritis left knee.  The symptoms are relatively mild but Mrs. Gabriela Eves just wanted some reassurance.  Prior films demonstrated mild arthritis in all 3 compartments.  Films today were basically unchanged.  There was no effusion and very minimal pain.  Discussed her diagnosis and treatment options.  Did not want cortisone today but will try Voltaren gel and over-the-counter medicines.  Follow-Up Instructions: Return if symptoms worsen or fail to improve.   Orders:  Orders Placed This Encounter  Procedures   XR KNEE 3 VIEW LEFT   No orders of the defined types were placed in this encounter.     Procedures: No procedures performed   Clinical Data: No additional findings.   Subjective: Chief Complaint  Patient presents with   Left Knee - Pain  Patient presents today for left knee pain. She said that it has been hurting for 6 months. No worse or better in the last 6 months. She received a cortisone injection in 2021. Most of her pain is laterally. She is taking tylenol as needed.   HPI  Review of Systems   Objective: Vital Signs: There were no vitals taken for this visit.  Physical Exam Constitutional:      Appearance: She is well-developed.  Eyes:     Pupils: Pupils are equal, round, and reactive to light.  Pulmonary:     Effort: Pulmonary effort is normal.  Skin:    General: Skin is warm and dry.  Neurological:     Mental Status: She is alert and oriented to person, place, and time.  Psychiatric:        Behavior: Behavior normal.    Ortho Exam awake alert and  oriented x3.  Left knee was not hot red warm or swollen.  No effusion.  A little bit of medial lateral joint pain but full extension and flexion referable to the right knee.  Little bit of patella crepitation but no pain with patella compression.  No instability.  No popliteal mass or pain.  No calf discomfort.  Walks without a limp.  No use of ambulatory aid  Specialty Comments:  No specialty comments available.  Imaging: No results found.   PMFS History: Patient Active Problem List   Diagnosis Date Noted   H/O adenomatous polyp of colon 04/20/2020   Unilateral primary osteoarthritis, left knee 05/12/2019   Unilateral primary osteoarthritis, right knee 03/21/2017   Heart palpitations    Other malaise and fatigue    Body mass index between 19-24, adult    Hypothyroidism    Past Medical History:  Diagnosis Date   Body mass index between 19-24, adult    Heart palpitations    High cholesterol    Hypothyroidism    Other malaise and fatigue     Family History  Problem Relation Age of Onset   Diverticulitis Mother    Other Other        does not  know father's medical history   Colon cancer Neg Hx     Past Surgical History:  Procedure Laterality Date   BACK SURGERY     CATARACT EXTRACTION     CHOLECYSTECTOMY     COLONOSCOPY N/A 06/15/2020   Procedure: COLONOSCOPY;  Surgeon: Corbin Ade, MD;  Location: [facility];  Service: Endoscopy;  Laterality: N/A;  PM   Social History   Occupational History   Not on file  Tobacco Use   Smoking status: Never   Smokeless tobacco: Never  Vaping Use   Vaping Use: Never used  Substance and Sexual Activity   Alcohol use: No   Drug use: No   Sexual activity: Not on file

## 2021-03-02 DIAGNOSIS — E7849 Other hyperlipidemia: Secondary | ICD-10-CM | POA: Diagnosis not present

## 2021-03-02 DIAGNOSIS — Z0000 Encounter for general adult medical examination without abnormal findings: Secondary | ICD-10-CM | POA: Diagnosis not present

## 2021-03-02 DIAGNOSIS — Z6825 Body mass index (BMI) 25.0-25.9, adult: Secondary | ICD-10-CM | POA: Diagnosis not present

## 2021-03-02 DIAGNOSIS — I10 Essential (primary) hypertension: Secondary | ICD-10-CM | POA: Diagnosis not present

## 2021-03-02 DIAGNOSIS — E038 Other specified hypothyroidism: Secondary | ICD-10-CM | POA: Diagnosis not present

## 2021-03-31 DIAGNOSIS — Z01419 Encounter for gynecological examination (general) (routine) without abnormal findings: Secondary | ICD-10-CM | POA: Diagnosis not present

## 2021-03-31 DIAGNOSIS — Z1231 Encounter for screening mammogram for malignant neoplasm of breast: Secondary | ICD-10-CM | POA: Diagnosis not present

## 2021-03-31 DIAGNOSIS — Z4689 Encounter for fitting and adjustment of other specified devices: Secondary | ICD-10-CM | POA: Diagnosis not present

## 2021-03-31 DIAGNOSIS — Z124 Encounter for screening for malignant neoplasm of cervix: Secondary | ICD-10-CM | POA: Diagnosis not present

## 2021-03-31 DIAGNOSIS — Z01411 Encounter for gynecological examination (general) (routine) with abnormal findings: Secondary | ICD-10-CM | POA: Diagnosis not present

## 2021-03-31 DIAGNOSIS — N951 Menopausal and female climacteric states: Secondary | ICD-10-CM | POA: Diagnosis not present

## 2021-03-31 DIAGNOSIS — R32 Unspecified urinary incontinence: Secondary | ICD-10-CM | POA: Diagnosis not present

## 2021-03-31 DIAGNOSIS — Z6825 Body mass index (BMI) 25.0-25.9, adult: Secondary | ICD-10-CM | POA: Diagnosis not present

## 2021-09-06 DIAGNOSIS — Z6824 Body mass index (BMI) 24.0-24.9, adult: Secondary | ICD-10-CM | POA: Diagnosis not present

## 2021-09-06 DIAGNOSIS — E038 Other specified hypothyroidism: Secondary | ICD-10-CM | POA: Diagnosis not present

## 2021-09-06 DIAGNOSIS — I10 Essential (primary) hypertension: Secondary | ICD-10-CM | POA: Diagnosis not present

## 2021-09-06 DIAGNOSIS — E7849 Other hyperlipidemia: Secondary | ICD-10-CM | POA: Diagnosis not present

## 2021-12-28 DIAGNOSIS — E7849 Other hyperlipidemia: Secondary | ICD-10-CM | POA: Diagnosis not present

## 2021-12-28 DIAGNOSIS — Z6825 Body mass index (BMI) 25.0-25.9, adult: Secondary | ICD-10-CM | POA: Diagnosis not present

## 2021-12-28 DIAGNOSIS — Z0000 Encounter for general adult medical examination without abnormal findings: Secondary | ICD-10-CM | POA: Diagnosis not present

## 2021-12-28 DIAGNOSIS — E038 Other specified hypothyroidism: Secondary | ICD-10-CM | POA: Diagnosis not present

## 2021-12-28 DIAGNOSIS — I10 Essential (primary) hypertension: Secondary | ICD-10-CM | POA: Diagnosis not present

## 2022-01-31 DIAGNOSIS — H524 Presbyopia: Secondary | ICD-10-CM | POA: Diagnosis not present

## 2022-01-31 DIAGNOSIS — Z961 Presence of intraocular lens: Secondary | ICD-10-CM | POA: Diagnosis not present

## 2022-01-31 DIAGNOSIS — H5211 Myopia, right eye: Secondary | ICD-10-CM | POA: Diagnosis not present

## 2022-01-31 DIAGNOSIS — H52201 Unspecified astigmatism, right eye: Secondary | ICD-10-CM | POA: Diagnosis not present

## 2022-02-06 DIAGNOSIS — Z6826 Body mass index (BMI) 26.0-26.9, adult: Secondary | ICD-10-CM | POA: Diagnosis not present

## 2022-02-06 DIAGNOSIS — H6122 Impacted cerumen, left ear: Secondary | ICD-10-CM | POA: Diagnosis not present

## 2022-04-12 DIAGNOSIS — Z1231 Encounter for screening mammogram for malignant neoplasm of breast: Secondary | ICD-10-CM | POA: Diagnosis not present

## 2022-04-12 DIAGNOSIS — Z124 Encounter for screening for malignant neoplasm of cervix: Secondary | ICD-10-CM | POA: Diagnosis not present

## 2022-04-12 DIAGNOSIS — Z01411 Encounter for gynecological examination (general) (routine) with abnormal findings: Secondary | ICD-10-CM | POA: Diagnosis not present

## 2022-04-12 DIAGNOSIS — Z6826 Body mass index (BMI) 26.0-26.9, adult: Secondary | ICD-10-CM | POA: Diagnosis not present

## 2022-04-12 DIAGNOSIS — N952 Postmenopausal atrophic vaginitis: Secondary | ICD-10-CM | POA: Diagnosis not present

## 2022-04-12 DIAGNOSIS — Z01419 Encounter for gynecological examination (general) (routine) without abnormal findings: Secondary | ICD-10-CM | POA: Diagnosis not present

## 2022-04-12 DIAGNOSIS — N393 Stress incontinence (female) (male): Secondary | ICD-10-CM | POA: Diagnosis not present

## 2022-04-16 DIAGNOSIS — R921 Mammographic calcification found on diagnostic imaging of breast: Secondary | ICD-10-CM | POA: Diagnosis not present

## 2022-04-16 DIAGNOSIS — R92321 Mammographic fibroglandular density, right breast: Secondary | ICD-10-CM | POA: Diagnosis not present

## 2022-04-16 DIAGNOSIS — R928 Other abnormal and inconclusive findings on diagnostic imaging of breast: Secondary | ICD-10-CM | POA: Diagnosis not present

## 2022-04-19 DIAGNOSIS — D0511 Intraductal carcinoma in situ of right breast: Secondary | ICD-10-CM | POA: Diagnosis not present

## 2022-04-19 DIAGNOSIS — Z170 Estrogen receptor positive status [ER+]: Secondary | ICD-10-CM | POA: Diagnosis not present

## 2022-04-19 DIAGNOSIS — R921 Mammographic calcification found on diagnostic imaging of breast: Secondary | ICD-10-CM | POA: Diagnosis not present

## 2022-04-25 DIAGNOSIS — Z803 Family history of malignant neoplasm of breast: Secondary | ICD-10-CM | POA: Diagnosis not present

## 2022-04-25 DIAGNOSIS — Z7183 Encounter for nonprocreative genetic counseling: Secondary | ICD-10-CM | POA: Diagnosis not present

## 2022-04-25 DIAGNOSIS — Z1371 Encounter for nonprocreative screening for genetic disease carrier status: Secondary | ICD-10-CM | POA: Diagnosis not present

## 2022-04-25 DIAGNOSIS — Z1330 Encounter for screening examination for mental health and behavioral disorders, unspecified: Secondary | ICD-10-CM | POA: Diagnosis not present

## 2022-04-25 DIAGNOSIS — D0511 Intraductal carcinoma in situ of right breast: Secondary | ICD-10-CM | POA: Diagnosis not present

## 2022-04-30 DIAGNOSIS — N8110 Cystocele, unspecified: Secondary | ICD-10-CM | POA: Diagnosis not present

## 2022-04-30 DIAGNOSIS — Z4689 Encounter for fitting and adjustment of other specified devices: Secondary | ICD-10-CM | POA: Diagnosis not present

## 2022-05-02 DIAGNOSIS — D0511 Intraductal carcinoma in situ of right breast: Secondary | ICD-10-CM | POA: Diagnosis not present

## 2022-05-08 DIAGNOSIS — D0511 Intraductal carcinoma in situ of right breast: Secondary | ICD-10-CM | POA: Diagnosis not present

## 2022-05-15 DIAGNOSIS — Z170 Estrogen receptor positive status [ER+]: Secondary | ICD-10-CM | POA: Diagnosis not present

## 2022-05-15 DIAGNOSIS — C50911 Malignant neoplasm of unspecified site of right female breast: Secondary | ICD-10-CM | POA: Diagnosis not present

## 2022-05-15 DIAGNOSIS — D0511 Intraductal carcinoma in situ of right breast: Secondary | ICD-10-CM | POA: Diagnosis not present

## 2022-05-15 DIAGNOSIS — I10 Essential (primary) hypertension: Secondary | ICD-10-CM | POA: Diagnosis not present

## 2022-05-15 DIAGNOSIS — E079 Disorder of thyroid, unspecified: Secondary | ICD-10-CM | POA: Diagnosis not present

## 2022-05-15 DIAGNOSIS — E785 Hyperlipidemia, unspecified: Secondary | ICD-10-CM | POA: Diagnosis not present

## 2022-05-23 DIAGNOSIS — D0511 Intraductal carcinoma in situ of right breast: Secondary | ICD-10-CM | POA: Diagnosis not present

## 2022-06-05 DIAGNOSIS — I10 Essential (primary) hypertension: Secondary | ICD-10-CM | POA: Diagnosis not present

## 2022-06-05 DIAGNOSIS — E785 Hyperlipidemia, unspecified: Secondary | ICD-10-CM | POA: Diagnosis not present

## 2022-06-05 DIAGNOSIS — D0511 Intraductal carcinoma in situ of right breast: Secondary | ICD-10-CM | POA: Diagnosis not present

## 2022-06-05 DIAGNOSIS — Z79899 Other long term (current) drug therapy: Secondary | ICD-10-CM | POA: Diagnosis not present

## 2022-06-05 DIAGNOSIS — Z79890 Hormone replacement therapy (postmenopausal): Secondary | ICD-10-CM | POA: Diagnosis not present

## 2022-06-05 DIAGNOSIS — Z803 Family history of malignant neoplasm of breast: Secondary | ICD-10-CM | POA: Diagnosis not present

## 2022-06-05 DIAGNOSIS — N6081 Other benign mammary dysplasias of right breast: Secondary | ICD-10-CM | POA: Diagnosis not present

## 2022-06-05 DIAGNOSIS — N6091 Unspecified benign mammary dysplasia of right breast: Secondary | ICD-10-CM | POA: Diagnosis not present

## 2022-06-13 DIAGNOSIS — D0511 Intraductal carcinoma in situ of right breast: Secondary | ICD-10-CM | POA: Diagnosis not present

## 2022-06-19 DIAGNOSIS — D0511 Intraductal carcinoma in situ of right breast: Secondary | ICD-10-CM | POA: Diagnosis not present

## 2022-06-21 DIAGNOSIS — D0511 Intraductal carcinoma in situ of right breast: Secondary | ICD-10-CM | POA: Diagnosis not present

## 2022-06-28 DIAGNOSIS — Z0000 Encounter for general adult medical examination without abnormal findings: Secondary | ICD-10-CM | POA: Diagnosis not present

## 2022-06-28 DIAGNOSIS — Z6826 Body mass index (BMI) 26.0-26.9, adult: Secondary | ICD-10-CM | POA: Diagnosis not present

## 2022-06-28 DIAGNOSIS — E038 Other specified hypothyroidism: Secondary | ICD-10-CM | POA: Diagnosis not present

## 2022-06-28 DIAGNOSIS — I10 Essential (primary) hypertension: Secondary | ICD-10-CM | POA: Diagnosis not present

## 2022-06-28 DIAGNOSIS — E7849 Other hyperlipidemia: Secondary | ICD-10-CM | POA: Diagnosis not present

## 2022-07-03 DIAGNOSIS — D0511 Intraductal carcinoma in situ of right breast: Secondary | ICD-10-CM | POA: Diagnosis not present

## 2022-07-04 DIAGNOSIS — D0511 Intraductal carcinoma in situ of right breast: Secondary | ICD-10-CM | POA: Diagnosis not present

## 2022-07-04 DIAGNOSIS — R920 Mammographic microcalcification found on diagnostic imaging of breast: Secondary | ICD-10-CM | POA: Diagnosis not present

## 2022-07-04 DIAGNOSIS — R92321 Mammographic fibroglandular density, right breast: Secondary | ICD-10-CM | POA: Diagnosis not present

## 2022-07-12 DIAGNOSIS — R920 Mammographic microcalcification found on diagnostic imaging of breast: Secondary | ICD-10-CM | POA: Diagnosis not present

## 2022-07-12 DIAGNOSIS — N641 Fat necrosis of breast: Secondary | ICD-10-CM | POA: Diagnosis not present

## 2022-07-12 DIAGNOSIS — D0511 Intraductal carcinoma in situ of right breast: Secondary | ICD-10-CM | POA: Diagnosis not present

## 2022-07-24 DIAGNOSIS — E079 Disorder of thyroid, unspecified: Secondary | ICD-10-CM | POA: Diagnosis not present

## 2022-07-24 DIAGNOSIS — Z9889 Other specified postprocedural states: Secondary | ICD-10-CM | POA: Diagnosis not present

## 2022-07-24 DIAGNOSIS — N6092 Unspecified benign mammary dysplasia of left breast: Secondary | ICD-10-CM | POA: Diagnosis not present

## 2022-07-24 DIAGNOSIS — D0511 Intraductal carcinoma in situ of right breast: Secondary | ICD-10-CM | POA: Diagnosis not present

## 2022-07-24 DIAGNOSIS — Z7982 Long term (current) use of aspirin: Secondary | ICD-10-CM | POA: Diagnosis not present

## 2022-07-24 DIAGNOSIS — E785 Hyperlipidemia, unspecified: Secondary | ICD-10-CM | POA: Diagnosis not present

## 2022-07-24 DIAGNOSIS — Z170 Estrogen receptor positive status [ER+]: Secondary | ICD-10-CM | POA: Diagnosis not present

## 2022-07-24 DIAGNOSIS — Z803 Family history of malignant neoplasm of breast: Secondary | ICD-10-CM | POA: Diagnosis not present

## 2022-07-24 DIAGNOSIS — I493 Ventricular premature depolarization: Secondary | ICD-10-CM | POA: Diagnosis not present

## 2022-07-24 DIAGNOSIS — R921 Mammographic calcification found on diagnostic imaging of breast: Secondary | ICD-10-CM | POA: Diagnosis not present

## 2022-07-24 DIAGNOSIS — I10 Essential (primary) hypertension: Secondary | ICD-10-CM | POA: Diagnosis not present

## 2022-08-28 DIAGNOSIS — D0510 Intraductal carcinoma in situ of unspecified breast: Secondary | ICD-10-CM | POA: Diagnosis not present

## 2022-08-28 DIAGNOSIS — Z79890 Hormone replacement therapy (postmenopausal): Secondary | ICD-10-CM | POA: Diagnosis not present

## 2022-08-28 DIAGNOSIS — I10 Essential (primary) hypertension: Secondary | ICD-10-CM | POA: Diagnosis not present

## 2022-08-28 DIAGNOSIS — Z79810 Long term (current) use of selective estrogen receptor modulators (SERMs): Secondary | ICD-10-CM | POA: Diagnosis not present

## 2022-08-28 DIAGNOSIS — E079 Disorder of thyroid, unspecified: Secondary | ICD-10-CM | POA: Diagnosis not present

## 2022-08-28 DIAGNOSIS — E785 Hyperlipidemia, unspecified: Secondary | ICD-10-CM | POA: Diagnosis not present

## 2022-08-28 DIAGNOSIS — Z7982 Long term (current) use of aspirin: Secondary | ICD-10-CM | POA: Diagnosis not present

## 2022-08-28 DIAGNOSIS — D0511 Intraductal carcinoma in situ of right breast: Secondary | ICD-10-CM | POA: Diagnosis not present

## 2022-08-28 DIAGNOSIS — Z79899 Other long term (current) drug therapy: Secondary | ICD-10-CM | POA: Diagnosis not present

## 2022-08-29 DIAGNOSIS — E785 Hyperlipidemia, unspecified: Secondary | ICD-10-CM | POA: Diagnosis not present

## 2022-08-29 DIAGNOSIS — Z79810 Long term (current) use of selective estrogen receptor modulators (SERMs): Secondary | ICD-10-CM | POA: Diagnosis not present

## 2022-08-29 DIAGNOSIS — Z79890 Hormone replacement therapy (postmenopausal): Secondary | ICD-10-CM | POA: Diagnosis not present

## 2022-08-29 DIAGNOSIS — Z79899 Other long term (current) drug therapy: Secondary | ICD-10-CM | POA: Diagnosis not present

## 2022-08-29 DIAGNOSIS — I10 Essential (primary) hypertension: Secondary | ICD-10-CM | POA: Diagnosis not present

## 2022-08-29 DIAGNOSIS — E079 Disorder of thyroid, unspecified: Secondary | ICD-10-CM | POA: Diagnosis not present

## 2022-08-29 DIAGNOSIS — D0511 Intraductal carcinoma in situ of right breast: Secondary | ICD-10-CM | POA: Diagnosis not present

## 2022-08-29 DIAGNOSIS — Z7982 Long term (current) use of aspirin: Secondary | ICD-10-CM | POA: Diagnosis not present

## 2022-09-05 DIAGNOSIS — Z9889 Other specified postprocedural states: Secondary | ICD-10-CM | POA: Diagnosis not present

## 2022-09-05 DIAGNOSIS — D0511 Intraductal carcinoma in situ of right breast: Secondary | ICD-10-CM | POA: Diagnosis not present

## 2022-09-10 DIAGNOSIS — L255 Unspecified contact dermatitis due to plants, except food: Secondary | ICD-10-CM | POA: Diagnosis not present

## 2022-09-10 DIAGNOSIS — Z6826 Body mass index (BMI) 26.0-26.9, adult: Secondary | ICD-10-CM | POA: Diagnosis not present

## 2022-09-18 DIAGNOSIS — D0511 Intraductal carcinoma in situ of right breast: Secondary | ICD-10-CM | POA: Diagnosis not present

## 2022-09-18 DIAGNOSIS — Z9889 Other specified postprocedural states: Secondary | ICD-10-CM | POA: Diagnosis not present

## 2022-12-27 DIAGNOSIS — L255 Unspecified contact dermatitis due to plants, except food: Secondary | ICD-10-CM | POA: Diagnosis not present

## 2022-12-27 DIAGNOSIS — E7849 Other hyperlipidemia: Secondary | ICD-10-CM | POA: Diagnosis not present

## 2022-12-27 DIAGNOSIS — Z0000 Encounter for general adult medical examination without abnormal findings: Secondary | ICD-10-CM | POA: Diagnosis not present

## 2022-12-27 DIAGNOSIS — I10 Essential (primary) hypertension: Secondary | ICD-10-CM | POA: Diagnosis not present

## 2022-12-27 DIAGNOSIS — Z6824 Body mass index (BMI) 24.0-24.9, adult: Secondary | ICD-10-CM | POA: Diagnosis not present

## 2022-12-27 DIAGNOSIS — N1831 Chronic kidney disease, stage 3a: Secondary | ICD-10-CM | POA: Diagnosis not present

## 2022-12-27 DIAGNOSIS — E038 Other specified hypothyroidism: Secondary | ICD-10-CM | POA: Diagnosis not present

## 2023-02-02 ENCOUNTER — Encounter (HOSPITAL_COMMUNITY): Payer: Self-pay | Admitting: *Deleted

## 2023-02-02 ENCOUNTER — Other Ambulatory Visit: Payer: Self-pay

## 2023-02-02 ENCOUNTER — Emergency Department (HOSPITAL_COMMUNITY)
Admission: EM | Admit: 2023-02-02 | Discharge: 2023-02-02 | Disposition: A | Discharge status: Home / Self Care | Payer: Medicare PPO | Attending: Emergency Medicine | Admitting: Emergency Medicine

## 2023-02-02 ENCOUNTER — Emergency Department (HOSPITAL_COMMUNITY): Payer: Medicare PPO

## 2023-02-02 DIAGNOSIS — R200 Anesthesia of skin: Secondary | ICD-10-CM | POA: Insufficient documentation

## 2023-02-02 DIAGNOSIS — R202 Paresthesia of skin: Secondary | ICD-10-CM | POA: Diagnosis not present

## 2023-02-02 DIAGNOSIS — R079 Chest pain, unspecified: Secondary | ICD-10-CM | POA: Insufficient documentation

## 2023-02-02 DIAGNOSIS — M79602 Pain in left arm: Secondary | ICD-10-CM

## 2023-02-02 DIAGNOSIS — R0789 Other chest pain: Secondary | ICD-10-CM | POA: Diagnosis not present

## 2023-02-02 DIAGNOSIS — I10 Essential (primary) hypertension: Secondary | ICD-10-CM | POA: Insufficient documentation

## 2023-02-02 DIAGNOSIS — Z79899 Other long term (current) drug therapy: Secondary | ICD-10-CM | POA: Insufficient documentation

## 2023-02-02 DIAGNOSIS — Z7982 Long term (current) use of aspirin: Secondary | ICD-10-CM | POA: Insufficient documentation

## 2023-02-02 LAB — CBC
HCT: 41.7 % (ref 36.0–46.0)
Hemoglobin: 13.7 g/dL (ref 12.0–15.0)
MCH: 32.3 pg (ref 26.0–34.0)
MCHC: 32.9 g/dL (ref 30.0–36.0)
MCV: 98.3 fL (ref 80.0–100.0)
Platelets: 285 10*3/uL (ref 150–400)
RBC: 4.24 MIL/uL (ref 3.87–5.11)
RDW: 11.9 % (ref 11.5–15.5)
WBC: 8.5 10*3/uL (ref 4.0–10.5)
nRBC: 0 % (ref 0.0–0.2)

## 2023-02-02 LAB — BASIC METABOLIC PANEL
Anion gap: 11 (ref 5–15)
BUN: 19 mg/dL (ref 8–23)
CO2: 24 mmol/L (ref 22–32)
Calcium: 9.3 mg/dL (ref 8.9–10.3)
Chloride: 104 mmol/L (ref 98–111)
Creatinine, Ser: 1.21 mg/dL — ABNORMAL HIGH (ref 0.44–1.00)
GFR, Estimated: 45 mL/min — ABNORMAL LOW (ref >60)
Glucose, Bld: 138 mg/dL — ABNORMAL HIGH (ref 70–99)
Potassium: 3.8 mmol/L (ref 3.5–5.1)
Sodium: 139 mmol/L (ref 135–145)

## 2023-02-02 LAB — TROPONIN I (HIGH SENSITIVITY)
Troponin I (High Sensitivity): 7 ng/L (ref <18)
Troponin I (High Sensitivity): 7 ng/L (ref <18)

## 2023-02-02 NOTE — Discharge Instructions (Signed)
 You were seen in the emerged from for chest pain and left arm pain Your EKG x-ray and blood work all looked okay We do not think this is related to your heart at this time It may be a muscle strain or nerve pain For this reason is to follow-up with your primary care doctor in 1 week for reevaluation Return to the emerged part for chest pain trouble breathing or any other concerns

## 2023-02-02 NOTE — ED Provider Triage Note (Signed)
 Emergency Medicine Provider Triage Evaluation Note  Ayriana Wix , a 83 y.o. female  was evaluated in triage.  Pt complains of chest pain.  Review of Systems  Positive:  Negative:   Physical Exam  BP (!) 174/64 (BP Location: Right Arm)   Pulse 74   Temp 97.7 F (36.5 C)   Resp 18   Ht 5' 7 (1.702 m)   Wt 71.2 kg   SpO2 99%   BMI 24.58 kg/m  Gen:   Awake, no distress   Resp:  Normal effort  MSK:   Moves extremities without difficulty  Other:    Medical Decision Making  Medically screening exam initiated at 7:10 PM.  Appropriate orders placed.  Merit Hattery was informed that the remainder of the evaluation will be completed by another provider, this initial triage assessment does not replace that evaluation, and the importance of remaining in the ED until their evaluation is complete.  BL arm pain this morning which eventually resolved and then recurred again accompanied with chest pain. Patient also stating that she felt a little dizzy earlier.    Hoy Nidia FALCON, [place] 02/02/23 1911

## 2023-02-02 NOTE — ED Triage Notes (Signed)
 The pt took 4 81 mg aspirin before she came here

## 2023-02-02 NOTE — ED Triage Notes (Signed)
 A request has been made for a mse by the pa  she's here now

## 2023-02-02 NOTE — ED Provider Notes (Signed)
 [place] EMERGENCY DEPARTMENT AT [facility] Provider Note   CSN: [id number] Arrival date & time: 02/02/23  1831     History  Chief Complaint  Patient presents with   Chest Pain    Shari Camacho is a 83 y.o. female.  With a history of hypertension and hyperlipidemia presents to the ED for arm pain.  She first experienced pain in bilateral arms this morning which resolved spontaneously during the afternoon.  Tonight around 1815 while watching TV at rest she began to experience left upper extremity pain which has persisted since the onset.  Pain has a burning sensation with tingling in her left fingers.  She also notes left-sided chest pain during that time as well.  No nausea vomiting diaphoresis or shortness of breath.  No fevers chills or recent illness.  Patient took 4X 81 mg ASA prior to arrival.  No other medications.  No prior history of MI   Chest Pain      Home Medications Prior to Admission medications   Medication Sig Start Date End Date Taking? Authorizing Provider  atorvastatin (LIPITOR) 20 MG tablet Take 20 mg by mouth daily after breakfast.    [provider]  Cholecalciferol (VITAMIN D3) 50 MCG (2000 UT) TABS Take 2,000 Units by mouth in the morning.    [provider]  Glucosamine Sulfate-MSM (GLUCOSAMINE-MSM DS PO) Take 1 tablet by mouth in the morning.    [provider]  GRAPE SEED EXTRACT PO Take 400 mg by mouth in the morning.    [provider]  HM ASPIRIN EC LOW DOSE 81 MG EC tablet Take 81 mg by mouth daily. 01/21/20   [provider]  Anselm Oil 500 MG CAPS Take 1,000 mg by mouth in the morning.    [provider]  levothyroxine (SYNTHROID) 88 MCG tablet Take 88 mcg by mouth daily before breakfast. 12/10/12   [provider]  metoprolol  succinate (TOPROL  XL) 25 MG 24 hr tablet Take 1 tablet (25 mg total) by mouth daily. Patient taking differently: Take 25 mg by mouth every evening.  03/02/13   Jeffrie Oneil BROCKS, MD  triamterene-hydrochlorothiazide (MAXZIDE-25) 37.5-25 MG per tablet Take 1 tablet by mouth in the morning. 12/29/12   [provider]      Allergies    Patient has no known allergies.    Review of Systems   Review of Systems  Cardiovascular:  Positive for chest pain.    Physical Exam Updated Vital Signs BP 138/62   Pulse 69   Temp 97.7 F (36.5 C)   Resp 20   Ht 5' 7 (1.702 m)   Wt 71.2 kg   SpO2 98%   BMI 24.58 kg/m  Physical Exam Vitals and nursing note reviewed.  HENT:     Head: Normocephalic and atraumatic.  Eyes:     Pupils: Pupils are equal, round, and reactive to light.  Cardiovascular:     Rate and Rhythm: Normal rate and regular rhythm.  Pulmonary:     Effort: Pulmonary effort is normal.     Breath sounds: Normal breath sounds.  Abdominal:     Palpations: Abdomen is soft.     Tenderness: There is no abdominal tenderness.  Musculoskeletal:     Comments: Sensation tact light touch throughout bilateral upper extremities 5 out of 5 motor strength through bilateral upper extremities 2+ radial pulses bilaterally  Skin:    General: Skin is warm and dry.  Neurological:  Mental Status: She is alert.  Psychiatric:        Mood and Affect: Mood normal.     ED Results / Procedures / Treatments   Labs (all labs ordered are listed, but only abnormal results are displayed) Labs Reviewed  BASIC METABOLIC PANEL - Abnormal; Notable for the following components:      Result Value   Glucose, Bld 138 (*)    Creatinine, Ser 1.21 (*)    GFR, Estimated 45 (*)    All other components within normal limits  CBC  TROPONIN I (HIGH SENSITIVITY)  TROPONIN I (HIGH SENSITIVITY)    EKG EKG Interpretation Date/Time:  Saturday February 02 2023 18:41:12 EST Ventricular Rate:  73 PR Interval:  172 QRS Duration:  116 QT Interval:  442 QTC Calculation: 486 R Axis:   75  Text Interpretation: Normal sinus rhythm Minimal voltage criteria  for LVH, may be normal variant ( Cornell product ) Anteroseptal infarct , age undetermined Abnormal ECG No previous ECGs available Confirmed by Pamella Sharper [phone]) on 02/02/2023 8:19:40 PM  Radiology DG Chest 2 View Result Date: 02/02/2023 CLINICAL DATA:  chest pain lt arm numbness EXAM: CHEST - 2 VIEW COMPARISON:  None Available. FINDINGS: The heart and mediastinal contours are within normal limits. No focal consolidation. No pulmonary edema. No pleural effusion. No pneumothorax. No acute osseous abnormality. IMPRESSION: No active cardiopulmonary disease. Electronically Signed   By: Morgane  Naveau M.D.   On: 02/02/2023 19:29    Procedures Procedures    Medications Ordered in ED Medications - No data to display  ED Course/ Medical Decision Making/ A&P Clinical Course as of 02/02/23 2210  Sat Feb 02, 2023  2124 Initial laboratory workup unremarkable including high-sensitivity troponin of 7.  Will obtain delta troponin.  Patient has remained hemodynamically stable.  Declined offer of analgesia at this time. [MP]  2210 Delta troponin also 7.  Patient still having some left arm discomfort but no chest pain or shortness of breath.  She feels comfortable discharge home and will follow-up with her PCP.  She and her daughter understand return precautions will be worrisome for potential severe chest pain or shortness of breath [MP]    Clinical Course User Index [MP] Pamella Sharper LABOR, DO                                 Medical Decision Making 83 year old female with history as above presenting for acute left arm pain.  Some associated left-sided chest pain.  Benign physical exam including sensory neuroexam of both upper extremities.  Unclear etiology of her left upper extremity pain tonight.  Need to evaluate for ACS an elderly female with absence of troponin, laboratory workup, EKG and chest x-ray.  If there is no evidence of ACS may be musculoskeletal in origin versus neuropathic pain given her  history of cervical spine surgery.  Amount and/or Complexity of Data Reviewed Labs: ordered. Radiology: ordered.           Final Clinical Impression(s) / ED Diagnoses Final diagnoses:  Chest pain, unspecified type  Pain and numbness of left upper extremity    Rx / DC Orders ED Discharge Orders     None         Pamella Sharper LABOR, DO 02/02/23 2210

## 2023-02-02 NOTE — ED Triage Notes (Signed)
 The pt had bi-lateral arm pain since this am  then it went away this afternoon she had the lt sided chest pain and numbness in her lt arm  no longer in her rt arm  sl dizziness no nausea

## 2023-02-21 DIAGNOSIS — R079 Chest pain, unspecified: Secondary | ICD-10-CM | POA: Diagnosis not present

## 2023-02-21 DIAGNOSIS — I10 Essential (primary) hypertension: Secondary | ICD-10-CM | POA: Diagnosis not present

## 2023-02-21 DIAGNOSIS — Z6824 Body mass index (BMI) 24.0-24.9, adult: Secondary | ICD-10-CM | POA: Diagnosis not present

## 2023-04-15 DIAGNOSIS — R350 Frequency of micturition: Secondary | ICD-10-CM | POA: Diagnosis not present

## 2023-04-15 DIAGNOSIS — Z01419 Encounter for gynecological examination (general) (routine) without abnormal findings: Secondary | ICD-10-CM | POA: Diagnosis not present

## 2023-04-15 DIAGNOSIS — Z1331 Encounter for screening for depression: Secondary | ICD-10-CM | POA: Diagnosis not present

## 2023-04-15 DIAGNOSIS — Z124 Encounter for screening for malignant neoplasm of cervix: Secondary | ICD-10-CM | POA: Diagnosis not present

## 2023-05-21 DIAGNOSIS — R232 Flushing: Secondary | ICD-10-CM | POA: Diagnosis not present

## 2023-05-21 DIAGNOSIS — R42 Dizziness and giddiness: Secondary | ICD-10-CM | POA: Diagnosis not present

## 2023-05-21 DIAGNOSIS — Z79810 Long term (current) use of selective estrogen receptor modulators (SERMs): Secondary | ICD-10-CM | POA: Diagnosis not present

## 2023-05-21 DIAGNOSIS — E559 Vitamin D deficiency, unspecified: Secondary | ICD-10-CM | POA: Diagnosis not present

## 2023-05-21 DIAGNOSIS — R5383 Other fatigue: Secondary | ICD-10-CM | POA: Diagnosis not present

## 2023-05-21 DIAGNOSIS — D0511 Intraductal carcinoma in situ of right breast: Secondary | ICD-10-CM | POA: Diagnosis not present

## 2023-05-27 DIAGNOSIS — Z6825 Body mass index (BMI) 25.0-25.9, adult: Secondary | ICD-10-CM | POA: Diagnosis not present

## 2023-05-27 DIAGNOSIS — I952 Hypotension due to drugs: Secondary | ICD-10-CM | POA: Diagnosis not present

## 2023-05-31 DIAGNOSIS — Z1231 Encounter for screening mammogram for malignant neoplasm of breast: Secondary | ICD-10-CM | POA: Diagnosis not present

## 2023-05-31 DIAGNOSIS — R92323 Mammographic fibroglandular density, bilateral breasts: Secondary | ICD-10-CM | POA: Diagnosis not present

## 2023-06-19 DIAGNOSIS — D0511 Intraductal carcinoma in situ of right breast: Secondary | ICD-10-CM | POA: Diagnosis not present

## 2023-08-20 DIAGNOSIS — Z79810 Long term (current) use of selective estrogen receptor modulators (SERMs): Secondary | ICD-10-CM | POA: Diagnosis not present

## 2023-08-20 DIAGNOSIS — E559 Vitamin D deficiency, unspecified: Secondary | ICD-10-CM | POA: Diagnosis not present

## 2023-08-20 DIAGNOSIS — D0511 Intraductal carcinoma in situ of right breast: Secondary | ICD-10-CM | POA: Diagnosis not present

## 2023-10-28 DIAGNOSIS — Z6825 Body mass index (BMI) 25.0-25.9, adult: Secondary | ICD-10-CM | POA: Diagnosis not present

## 2023-10-28 DIAGNOSIS — I952 Hypotension due to drugs: Secondary | ICD-10-CM | POA: Diagnosis not present

## 2023-10-28 DIAGNOSIS — Z0000 Encounter for general adult medical examination without abnormal findings: Secondary | ICD-10-CM | POA: Diagnosis not present

## 2023-10-28 DIAGNOSIS — N1831 Chronic kidney disease, stage 3a: Secondary | ICD-10-CM | POA: Diagnosis not present

## 2023-10-28 DIAGNOSIS — E038 Other specified hypothyroidism: Secondary | ICD-10-CM | POA: Diagnosis not present

## 2023-10-28 DIAGNOSIS — E7849 Other hyperlipidemia: Secondary | ICD-10-CM | POA: Diagnosis not present

## 2023-11-07 DIAGNOSIS — Z961 Presence of intraocular lens: Secondary | ICD-10-CM | POA: Diagnosis not present
